# Patient Record
Sex: Female | Born: 1955 | Race: White | Hispanic: No | Marital: Single | Smoking: Former smoker
Health system: Southern US, Community
[De-identification: ages and names within clinical notes are randomized; demographics above are authoritative.]

## PROBLEM LIST (undated history)

## (undated) DIAGNOSIS — F419 Anxiety disorder, unspecified: Secondary | ICD-10-CM

## (undated) DIAGNOSIS — K589 Irritable bowel syndrome without diarrhea: Secondary | ICD-10-CM

## (undated) DIAGNOSIS — K52832 Lymphocytic colitis: Secondary | ICD-10-CM

## (undated) DIAGNOSIS — F329 Major depressive disorder, single episode, unspecified: Secondary | ICD-10-CM

## (undated) DIAGNOSIS — K5790 Diverticulosis of intestine, part unspecified, without perforation or abscess without bleeding: Secondary | ICD-10-CM

## (undated) DIAGNOSIS — F32A Depression, unspecified: Secondary | ICD-10-CM

## (undated) HISTORY — DX: Irritable bowel syndrome, unspecified: K58.9

## (undated) HISTORY — DX: Major depressive disorder, single episode, unspecified: F32.9

## (undated) HISTORY — PX: ARTHROSCOPIC REPAIR ACL: SUR80

## (undated) HISTORY — PX: GANGLION CYST EXCISION: SHX1691

## (undated) HISTORY — DX: Lymphocytic colitis: K52.832

## (undated) HISTORY — DX: Anxiety disorder, unspecified: F41.9

## (undated) HISTORY — DX: Depression, unspecified: F32.A

## (undated) HISTORY — DX: Diverticulosis of intestine, part unspecified, without perforation or abscess without bleeding: K57.90

---

## 1998-03-13 ENCOUNTER — Inpatient Hospital Stay (HOSPITAL_COMMUNITY): Admission: EM | Admit: 1998-03-13 | Discharge: 1998-03-16 | Payer: Self-pay | Admitting: Emergency Medicine

## 1999-01-04 ENCOUNTER — Other Ambulatory Visit: Admission: RE | Admit: 1999-01-04 | Discharge: 1999-01-04 | Payer: Self-pay | Admitting: *Deleted

## 1999-09-03 ENCOUNTER — Ambulatory Visit (HOSPITAL_COMMUNITY): Admission: RE | Admit: 1999-09-03 | Discharge: 1999-09-03 | Payer: Self-pay | Admitting: Gastroenterology

## 2000-09-28 ENCOUNTER — Encounter: Admission: RE | Admit: 2000-09-28 | Discharge: 2000-09-28 | Payer: Self-pay | Admitting: Family Medicine

## 2000-09-28 ENCOUNTER — Encounter: Payer: Self-pay | Admitting: Family Medicine

## 2001-07-31 ENCOUNTER — Other Ambulatory Visit: Admission: RE | Admit: 2001-07-31 | Discharge: 2001-07-31 | Payer: Self-pay | Admitting: Internal Medicine

## 2002-08-07 ENCOUNTER — Other Ambulatory Visit: Admission: RE | Admit: 2002-08-07 | Discharge: 2002-08-07 | Payer: Self-pay | Admitting: *Deleted

## 2003-07-26 LAB — HM COLONOSCOPY: HM Colonoscopy: NORMAL

## 2007-11-23 LAB — CONVERTED CEMR LAB: Pap Smear: NORMAL

## 2008-03-25 LAB — HM MAMMOGRAPHY: HM Mammogram: NORMAL

## 2009-06-17 ENCOUNTER — Ambulatory Visit: Payer: Self-pay | Admitting: Family Medicine

## 2009-06-17 DIAGNOSIS — F329 Major depressive disorder, single episode, unspecified: Secondary | ICD-10-CM

## 2009-06-17 DIAGNOSIS — B009 Herpesviral infection, unspecified: Secondary | ICD-10-CM | POA: Insufficient documentation

## 2009-07-14 ENCOUNTER — Ambulatory Visit: Payer: Self-pay | Admitting: Family Medicine

## 2009-07-14 DIAGNOSIS — J069 Acute upper respiratory infection, unspecified: Secondary | ICD-10-CM | POA: Insufficient documentation

## 2009-12-01 ENCOUNTER — Ambulatory Visit: Payer: Self-pay | Admitting: Family Medicine

## 2009-12-01 DIAGNOSIS — N959 Unspecified menopausal and perimenopausal disorder: Secondary | ICD-10-CM | POA: Insufficient documentation

## 2010-01-29 ENCOUNTER — Ambulatory Visit: Payer: Self-pay | Admitting: Family Medicine

## 2010-02-03 ENCOUNTER — Ambulatory Visit: Payer: Self-pay | Admitting: Licensed Clinical Social Worker

## 2010-03-04 ENCOUNTER — Ambulatory Visit: Payer: Self-pay | Admitting: Family Medicine

## 2010-04-12 ENCOUNTER — Telehealth (INDEPENDENT_AMBULATORY_CARE_PROVIDER_SITE_OTHER): Payer: Self-pay | Admitting: *Deleted

## 2010-05-06 ENCOUNTER — Ambulatory Visit: Payer: Self-pay | Admitting: Family Medicine

## 2010-05-06 DIAGNOSIS — J309 Allergic rhinitis, unspecified: Secondary | ICD-10-CM | POA: Insufficient documentation

## 2010-05-06 DIAGNOSIS — G4700 Insomnia, unspecified: Secondary | ICD-10-CM | POA: Insufficient documentation

## 2010-07-07 ENCOUNTER — Telehealth: Payer: Self-pay | Admitting: Family Medicine

## 2010-07-12 ENCOUNTER — Ambulatory Visit: Payer: Self-pay | Admitting: Family Medicine

## 2010-07-12 DIAGNOSIS — L259 Unspecified contact dermatitis, unspecified cause: Secondary | ICD-10-CM

## 2010-07-27 ENCOUNTER — Telehealth: Payer: Self-pay | Admitting: Family Medicine

## 2010-07-28 ENCOUNTER — Encounter: Payer: Self-pay | Admitting: Family Medicine

## 2010-08-24 NOTE — Letter (Signed)
Summary: Out of Work  Adult nurse at [employer]  [address]   [place], [place] [address]   Phone: [phone]  Fax: [phone]    January 29, 2010   Employee:  Michelle Jensen [facility]    To Whom It May Concern:   For Medical reasons, please excuse the above named employee from work for the following dates:  Start:   01/29/2010  End:   01/29/2010, pt may return to work, regular duties on Monday, 02/01/2010  If you need additional information, please feel free to contact our office.         Sincerely,        Evelena Peat, MD

## 2010-08-24 NOTE — Progress Notes (Signed)
Summary: LMTCB 9-21  Phone Note Call from Patient Call back at Home Phone [phone]   Caller: VM Summary of Call: Bad panic attack.  Requesting Rx for med 1 mo worth Xanax or some antianxiety.  Take antidepressants & have had relapse, bad news.  [facility] Initial call taken by: Rudy Jew, RN,  April 12, 2010 3:45 PM  Follow-up for Phone Call        If depression has relapsed or worsened, pt needs to be seen to reassess.  I would be OK with calling in limited Lorazepam 0.5 mg one by mouth q 6 hours as needed severe anxiety #20 with no refills until follow up. Follow-up by: Evelena Peat MD,  April 12, 2010 5:39 PM  Additional Follow-up for Phone Call Additional follow up Details #1::        Left message to call back. Rudy Jew, RN  April 14, 2010 8:14 AM [facility] again.  Rudy Jew, RN  April 14, 2010 2:33 PM       Additional Follow-up for Phone Call Additional follow up Details #2::    Notified pt. Follow-up by: Lynann Beaver CMA,  April 15, 2010 3:05 PM  New/Updated Medications: LORAZEPAM 0.5 MG TABS (LORAZEPAM) One by mouth every  6 hrs as needed severe anxiety Prescriptions: LORAZEPAM 0.5 MG TABS (LORAZEPAM) One by mouth every  6 hrs as needed severe anxiety  #20 x 0   Entered by:   Rudy Jew, RN   Authorized by:   Evelena Peat MD   Signed by:   Rudy Jew, RN on 04/14/2010   Method used:   Telephoned to ...       [facility]  [place] [address] (retail)       [address]       [place], [place]  [address]       Ph: [phone] or [phone]       Fax: [phone]   RxID:   [id number]

## 2010-08-24 NOTE — Assessment & Plan Note (Signed)
Summary: fup//ccm   Vital Signs:  Patient profile:   55 year old female Menstrual status:  irregular Weight:      167 pounds Temp:     98.1 degrees F oral Pulse rate:   66 / minute BP sitting:   100 / 70  (left arm) Cuff size:   regular  Vitals Entered By: Romualdo Bolk, CMA (AAMA) (March 04, 2010 1:29 PM) CC: follow-up visit   History of Present Illness: Depression greatly improved. Recent addition of Abilify to her other medications. Brighter mood. Sleep is good. Good energy levels. She is also doing counseling which has helped considerably. Has read several  books which seemed to have helped her outlook as well. No side effects from medication. No exercise.   Current Medications (verified): 1)  Sertraline Hcl 100 Mg Tabs (Sertraline Hcl) .... Once Daily 2)  Venlafaxine Hcl 100 Mg Tabs (Venlafaxine Hcl) .... Once Daily 3)  Abilify 2 Mg Tabs (Aripiprazole) .... One By Mouth At Bedtime  Allergies (verified): No Known Drug Allergies  Past History:  Past Medical History: Last updated: 06/17/2009 Depression  Past Surgical History: Last updated: 06/17/2009 Arthroscopic ACL Rt knee 2003 Ganglion cyst right wrist 1974  Family History: Last updated: 06/17/2009 Family History of Colon CA, mother Family History of Alcoholism/Addiction, sister Family history emotional illness, siblings  Social History: Last updated: 06/17/2009 Occupation: Single Alcohol use-yes Past smoker, quit 2008  Physical Exam  General:  Well-developed,well-nourished,in no acute distress; alert,appropriate and cooperative throughout examination Neck:  No deformities, masses, or tenderness noted. Lungs:  Normal respiratory effort, chest expands symmetrically. Lungs are clear to auscultation, no crackles or wheezes. Heart:  Normal rate and regular rhythm. S1 and S2 normal without gallop, murmur, click, rub or other extra sounds. Psych:  normally interactive, good eye contact, not anxious  appearing, and not depressed appearing.     Impression & Recommendations:  Problem # 1:  DEPRESSION (ICD-311) Assessment Improved Cont Abilify at low dose with meds below.  She is encouraged to cont. counseling. Her updated medication list for this problem includes:    Sertraline Hcl 100 Mg Tabs (Sertraline hcl) ..... Once daily    Venlafaxine Hcl 100 Mg Tabs (Venlafaxine hcl) ..... Once daily  Complete Medication List: 1)  Sertraline Hcl 100 Mg Tabs (Sertraline hcl) .... Once daily 2)  Venlafaxine Hcl 100 Mg Tabs (Venlafaxine hcl) .... Once daily 3)  Abilify 2 Mg Tabs (Aripiprazole) .... One by mouth at bedtime  Patient Instructions: 1)  Please schedule a follow-up appointment in 6 months .  Prescriptions: ABILIFY 2 MG TABS (ARIPIPRAZOLE) one by mouth at bedtime  #30 x 11   Entered and Authorized by:   Evelena Peat MD   Signed by:   Evelena Peat MD on 03/04/2010   Method used:   [facility]ronically to        [facility]  [place] [address] [facility]retail)       [address]       [place], [place]  [address]       Ph: [phone] or [phone]       Fax: [phone]   RxID:   [id number]

## 2010-08-24 NOTE — Assessment & Plan Note (Signed)
Summary: DEPRESSION//SLM   Vital Signs:  Patient profile:   55 year old female Menstrual status:  irregular Weight:      168 pounds BMI:     28.06 Temp:     98.2 degrees F oral BP sitting:   110 / 78  (left arm) Cuff size:   regular  Vitals Entered By: Sid Falcon LPN (May 06, 2010 2:26 PM)  History of Present Illness: Here for followup depression issues. She takes medications including sertraline, Effexor, and Abilify. Recent p.r.n. use of lorazepam at night which is helping her sleep. She continues to battle some depression issues but no suicidal ideation. She's had some counseling though inconsistently. She has been missing some work because of depression flareups. Would like to have release noted to back to work next week. We've previously filled out FMLA papers.  Regarding her insomnia, she does not use excessive caffeine and no consistent night use of ETOH.  One week of nasal congestion intermittent sneezing and occasional dry cough. No fever. No body aches.  She has considered possible allergy trigger but has not taken any antihistamine.  Allergies (verified): No Known Drug Allergies  Past History:  Past Medical History: Last updated: 06/17/2009 Depression  Past Surgical History: Last updated: 06/17/2009 Arthroscopic ACL Rt knee 2003 Ganglion cyst right wrist 1974  Family History: Last updated: 06/17/2009 Family History of Colon CA, mother Family History of Alcoholism/Addiction, sister Family history emotional illness, siblings  Social History: Last updated: 05/06/2010 Occupation:  Stewardess Single Alcohol use-yes Past smoker, quit 2008 PMH-FH-SH reviewed for relevance  Social History: Occupation:  Stewardess Single Alcohol use-yes Past smoker, quit 2008  Review of Systems  The patient denies anorexia, fever, weight loss, hoarseness, chest pain, syncope, dyspnea on exertion, peripheral edema, prolonged cough, headaches, abdominal pain, and  muscle weakness.    Physical Exam  General:  Well-developed,well-nourished,in no acute distress; alert,appropriate and cooperative throughout examination Eyes:  pupils equal, pupils round, and pupils reactive to light.   Ears:  External ear exam shows no significant lesions or deformities.  Otoscopic examination reveals clear canals, tympanic membranes are intact bilaterally without bulging, retraction, inflammation or discharge. Hearing is grossly normal bilaterally. Nose:  External nasal examination shows no deformity or inflammation. Nasal mucosa are pink and moist without lesions or exudates. Mouth:  Oral mucosa and oropharynx without lesions or exudates.  Teeth in good repair. Neck:  No deformities, masses, or tenderness noted. Lungs:  Normal respiratory effort, chest expands symmetrically. Lungs are clear to auscultation, no crackles or wheezes. Heart:  Normal rate and regular rhythm. S1 and S2 normal without gallop, murmur, click, rub or other extra sounds. Neurologic:  alert & oriented X3, cranial nerves II-XII intact, and strength normal in all extremities.   Psych:  normally interactive, good eye contact, not depressed appearing, and slightly anxious.     Impression & Recommendations:  Problem # 1:  DEPRESSION (ICD-311) she is strongly encouraged to cont with counseling. Her updated medication list for this problem includes:    Sertraline Hcl 100 Mg Tabs (Sertraline hcl) ..... Once daily    Venlafaxine Hcl 100 Mg Tabs (Venlafaxine hcl) ..... Once daily    Lorazepam 0.5 Mg Tabs (Lorazepam) ..... One by mouth every  6 hrs as needed severe anxiety  Problem # 2:  ALLERGIC RHINITIS (ICD-477.9) try OTC Allegra or Zyrtec.  Problem # 3:  INSOMNIA, TRANSIENT (ICD-780.52) refilled lorazepam with rec for short term use only.  Complete Medication List: 1)  Sertraline Hcl 100  Mg Tabs (Sertraline hcl) .... Once daily 2)  Venlafaxine Hcl 100 Mg Tabs (Venlafaxine hcl) .... Once daily 3)   Abilify 2 Mg Tabs (Aripiprazole) .... One by mouth at bedtime 4)  Lorazepam 0.5 Mg Tabs (Lorazepam) .... One by mouth every  6 hrs as needed severe anxiety  Other Orders: Admin 1st Vaccine (87564) Flu Vaccine 20yrs + (33295)  Patient Instructions: 1)  try over-the-counter antihistamine such as Allegra or Zyrtec Prescriptions: LORAZEPAM 0.5 MG TABS (LORAZEPAM) One by mouth every  6 hrs as needed severe anxiety  #30 x 1   Entered and Authorized by:   Evelena Peat MD   Signed by:   Evelena Peat MD on 05/06/2010   Method used:   Print then Give to Patient   RxID:   [id number]      Flu Vaccine Consent Questions     Do you have a history of severe allergic reactions to this vaccine? no    Any prior history of allergic reactions to egg and/or gelatin? no    Do you have a sensitivity to the preservative Thimersol? no    Do you have a past history of Guillan-Barre Syndrome? no    Do you currently have an acute febrile illness? no    Have you ever had a severe reaction to latex? no    Vaccine information given and explained to patient? yes    Are you currently pregnant? no    Lot Number:[id number]   Exp Date:01/22/2011   Site Given  Left Deltoid IMbflu

## 2010-08-24 NOTE — Assessment & Plan Note (Signed)
Summary: trouble sleeping/njr   Vital Signs:  Patient profile:   55 year old female Menstrual status:  irregular Temp:     98.6 degrees F oral BP sitting:   130 / 70  (left arm) Cuff size:   regular  Vitals Entered By: Sid Falcon LPN (January 30, 8415 3:51 PM) CC: Trouble sleeping   History of Present Illness: Patient seen with worsening depressive symptoms.  Long history of recurrent depression treated for several years. Currently takes sertraline 100 mg daily and venlafaxine 100 mg daily. She has problems with early morning awakening, frequent crying spells, and low motivation. Pessimistic outlook toward the future. Denies suicidal ideation.  She has arranged to start back some counseling next week. She would like to consider augmentation in antidepressant medications at this time.  Allergies (verified): No Known Drug Allergies  Past History:  Past Medical History: Last updated: 06/17/2009 Depression  Social History: Last updated: 06/17/2009 Occupation: Single Alcohol use-yes Past smoker, quit 2008  Review of Systems      See HPI  Physical Exam  General:  patient is alert and cooperative. Tearful off and on during exam and interview Lungs:  Normal respiratory effort, chest expands symmetrically. Lungs are clear to auscultation, no crackles or wheezes. Heart:  normal rate and regular rhythm.   Psych:  Oriented X3, good eye contact, depressed affect, and slightly anxious.     Impression & Recommendations:  Problem # 1:  DEPRESSION (ICD-311) Assessment Deteriorated add Abilify 2 mg q.h.s. and titrate after 3 days to 4 mg as needed. Reassess in 3 weeks time.  Counseling has been arranged. Her updated medication list for this problem includes:    Sertraline Hcl 100 Mg Tabs (Sertraline hcl) ..... Once daily    Venlafaxine Hcl 100 Mg Tabs (Venlafaxine hcl) ..... Once daily  Complete Medication List: 1)  Sertraline Hcl 100 Mg Tabs (Sertraline hcl) .... Once  daily 2)  Venlafaxine Hcl 100 Mg Tabs (Venlafaxine hcl) .... Once daily 3)  Abilify 2 Mg Tabs (Aripiprazole) .... One by mouth at bedtime  Patient Instructions: 1)  Schedule followup appointment in 3 weeks to reassess 2)  May titrate Abilify to 2 tablets at night after 3 days as needed Prescriptions: ABILIFY 2 MG TABS (ARIPIPRAZOLE) one by mouth at bedtime  #30 x 1   Entered and Authorized by:   Evelena Peat MD   Signed by:   Evelena Peat MD on 01/29/2010   Method used:   [facility]        [facility]  [place] [address] [facility]       [address]       [place], [place]  [address]       Ph: [phone] or [phone]       Fax: [phone]   RxID:   [id number]

## 2010-08-24 NOTE — Letter (Signed)
Summary: Out of Work  Adult nurse at [employer]  [address]   [place], [place] [address]   Phone: [phone]  Fax: [phone]    Dec 01, 2009   Employee:  CANDIA [employer]ING[employer]    To Whom It May Concern:   For Medical reasons, please excuse the above named employee from work for the following dates:  Start:    End:   Patient may return to unrestricted work duty 12-01-09  If you need additional information, please feel free to contact our office.         Sincerely,    Evelena Peat MD

## 2010-08-24 NOTE — Assessment & Plan Note (Signed)
Summary: follow up/cjr   Vital Signs:  Patient profile:   55 year old female Menstrual status:  irregular Height:      65 inches Weight:      164 pounds Temp:     97.9 degrees F oral BP sitting:   112 / 72  (left arm) Cuff size:   regular  Vitals Entered By: Sid Falcon LPN (Dec 01, 2009 10:05 AM) CC: Follow-up, med refills   History of Present Illness: Patient here discuss multiple items. Depression relatively stable. Still has some relapses at times. Remains compliant with medication. Has taken part in some counseling since last visit.  No suicidal ideation.  Menses are becoming more irregular. Occasional hot flushes. She thinks she is nearing menopause.  Several preventative issues discussed. She needs repeat colonoscopy this year (pos FH of colon cancer in mother). Due for mammogram. No complete physical in over a year.  Out of work since around [place] secondary to depression issues. Needs work release at  this time.  Allergies (verified): No Known Drug Allergies  Past History:  Past Medical History: Last updated: 06/17/2009 Depression  Past Surgical History: Last updated: 06/17/2009 Arthroscopic ACL Rt knee 2003 Ganglion cyst right wrist 1974  Family History: Last updated: 06/17/2009 Family History of Colon CA, mother Family History of Alcoholism/Addiction, sister Family history emotional illness, siblings  Social History: Last updated: 06/17/2009 Occupation: Single Alcohol use-yes Past smoker, quit 2008 PMH-FH-SH reviewed for relevance  Review of Systems  The patient denies anorexia, fever, weight loss, weight gain, chest pain, syncope, dyspnea on exertion, peripheral edema, prolonged cough, headaches, hemoptysis, abdominal pain, hematochezia, and severe indigestion/heartburn.    Physical Exam  General:  Well-developed,well-nourished,in no acute distress; alert,appropriate and cooperative throughout examination Ears:  External ear exam shows no  significant lesions or deformities.  Otoscopic examination reveals clear canals, tympanic membranes are intact bilaterally without bulging, retraction, inflammation or discharge. Hearing is grossly normal bilaterally. Mouth:  Oral mucosa and oropharynx without lesions or exudates.  Teeth in good repair. Neck:  No deformities, masses, or tenderness noted. Lungs:  Normal respiratory effort, chest expands symmetrically. Lungs are clear to auscultation, no crackles or wheezes. Heart:  normal rate, regular rhythm, and no murmur.   Extremities:  No clubbing, cyanosis, edema, or deformity noted with normal full range of motion of all joints.   Psych:  normally interactive, good eye contact, not anxious appearing, and not depressed appearing.     Impression & Recommendations:  Problem # 1:  DEPRESSION (ICD-311) cont current meds.  Pt encouraged to continue counseling. Her updated medication list for this problem includes:    Sertraline Hcl 100 Mg Tabs (Sertraline hcl) ..... Once daily    Venlafaxine Hcl 100 Mg Tabs (Venlafaxine hcl) ..... Once daily  Problem # 2:  PERIMENOPAUSAL SYNDROME (ICD-627.9) Long discussion regarding pros and cons of hormone replacement therapy.  Pt encouraged to schedule CPE.  Complete Medication List: 1)  Sertraline Hcl 100 Mg Tabs (Sertraline hcl) .... Once daily 2)  Venlafaxine Hcl 100 Mg Tabs (Venlafaxine hcl) .... Once daily  Patient Instructions: 1)  scheduled Complete physical examination

## 2010-08-26 NOTE — Letter (Signed)
Summary: Out of Work  Adult nurse at [employer]  [address]   [place], [place] [address]   Phone: [phone]  Fax: [phone]    July 12, 2010          Employee:  Michelle Jensen    To Whom It May Concern:   For Medical reasons, please excuse the above named employee from work for the following dates:  Start:   07/12/2010  End:   07/12/2010  If you need additional information, please feel free to contact our office.         Sincerely,         Evelena Peat, MD

## 2010-08-26 NOTE — Progress Notes (Signed)
Summary: Cleared to RTW letter request  Phone Note Call from Patient   Caller: Patient Call For: Evelena Peat MD Summary of Call: Pt left a VM requesting a RTW release note.  Pt had OV on 12/19.  VM said she had a "family blow-up over the holidays" and needs ar RTW note, cleared to RTW on Tuesday 07/27/2010 Initial call taken by: Sid Falcon LPN,  July 27, 2010 3:14 PM  Follow-up for Phone Call        I was not aware that she was out of work (other than 07/12/10) when seen here.  May generate return to work note for the 3rd. Follow-up by: Evelena Peat MD,  July 27, 2010 6:08 PM  Additional Follow-up for Phone Call Additional follow up Details #1::        Will generate letter and pt informed ready to pick-up Additional Follow-up by: Sid Falcon LPN,  July 28, 2010 12:24 PM

## 2010-08-26 NOTE — Letter (Signed)
Summary: Out of Work  Adult nurse at [employer]  [address]   [place], [place] [address]   Phone: [phone]  Fax: [phone]    July 28, 2010   Employee:  Michelle Jensen [facility] [employer] [facility]    To Whom It May Concern:   For Medical reasons, please excuse the above named employee from work for the following dates:  Start:    End:   Pt cleared to return to work Tuesday, July 27, 2010  If you need additional information, please feel free to contact our office.         Sincerely,       Evelena Peat, MD

## 2010-08-26 NOTE — Assessment & Plan Note (Signed)
Summary: fu on meds/njr   Vital Signs:  Patient profile:   55 year old female Menstrual status:  irregular Weight:      170 pounds Temp:     98.1 degrees F oral BP sitting:   110 / 70  (left arm) Cuff size:   regular  Vitals Entered By: Sid Falcon LPN (July 12, 2010 1:37 PM)  History of Present Illness: followup depression. She has discontinued Abilify. Remains on sertraline and Effexor. Depression relatively stable. Currently not in counseling. Plan to start some exercise first of the year. Has been consuming some alcohol recently and she has recognized more than healthy at times.  Bilateral pruritus ear canals. No drainage. No other rashes. No aggravating or alleviating factors  Allergies (verified): No Known Drug Allergies  Past History:  Past Medical History: Last updated: 06/17/2009 Depression  Past Surgical History: Last updated: 06/17/2009 Arthroscopic ACL Rt knee 2003 Ganglion cyst right wrist 1974  Family History: Last updated: 06/17/2009 Family History of Colon CA, mother Family History of Alcoholism/Addiction, sister Family history emotional illness, siblings  Social History: Last updated: 05/06/2010 Occupation:  Stewardess Single Alcohol use-yes Past smoker, quit 2008 PMH-FH-SH reviewed for relevance  Physical Exam  General:  Well-developed,well-nourished,in no acute distress; alert,appropriate and cooperative throughout examination Ears:  mild scaling and eczema changes both ear canals. Eardrums normal Mouth:  Oral mucosa and oropharynx without lesions or exudates.  Teeth in good repair. Neck:  No deformities, masses, or tenderness noted. Lungs:  Normal respiratory effort, chest expands symmetrically. Lungs are clear to auscultation, no crackles or wheezes. Heart:  Normal rate and regular rhythm. S1 and S2 normal without gallop, murmur, click, rub or other extra sounds. Psych:  normally interactive, good eye contact, not depressed appearing,  and slightly anxious.     Impression & Recommendations:  Problem # 1:  DEPRESSION (ICD-311) discussed possible tapering of Venlafaxine and she prefers to wait after the New Year. Her updated medication list for this problem includes:    Sertraline Hcl 100 Mg Tabs (Sertraline hcl) ..... Once daily    Venlafaxine Hcl 100 Mg Tabs (Venlafaxine hcl) ..... Once daily    Lorazepam 0.5 Mg Tabs (Lorazepam) ..... One by mouth every  6 hrs as needed severe anxiety  Problem # 2:  ECZEMA (ICD-692.9) Assessment: New  Her updated medication list for this problem includes:    Elocon 0.1 % Lotn (Mometasone furoate) .Marland Kitchen... Apply to affected rash as needed  Complete Medication List: 1)  Sertraline Hcl 100 Mg Tabs (Sertraline hcl) .... Once daily 2)  Venlafaxine Hcl 100 Mg Tabs (Venlafaxine hcl) .... Once daily 3)  Abilify 2 Mg Tabs (Aripiprazole) .... One by mouth at bedtime 4)  Lorazepam 0.5 Mg Tabs (Lorazepam) .... One by mouth every  6 hrs as needed severe anxiety 5)  Elocon 0.1 % Lotn (Mometasone furoate) .... Apply to affected rash as needed Prescriptions: ELOCON 0.1 % LOTN (MOMETASONE FUROATE) apply to affected rash as needed  #1 bottle x 3   Entered and Authorized by:   Evelena Peat MD   Signed by:   Evelena Peat MD on 07/12/2010   Method used:   Print then Give to Patient   RxID:   [id number]    Orders Added: 1)  Est. Patient Level III [56213]

## 2010-08-26 NOTE — Progress Notes (Signed)
Summary: Lorazepam refill request  Phone Note From Pharmacy   [facility]  [place] [address] Call For: burchette  Summary of Call: Refill request from pharmacy for Lorazepam, #30 with 1 refill given to pt on 05/06/10 At 8/11 OV you had asked pt to return in 6 months, that would be Fed.  No appt scheduled yet. Initial call taken by: Sid Falcon LPN,  July 07, 2010 1:59 PM  Follow-up for Phone Call        schedule follow up and OK to refill times 3. Follow-up by: Evelena Peat MD,  July 07, 2010 5:56 PM  Additional Follow-up for Phone Call Additional follow up Details #1::        Msg left on home phone and pharmacy pt needs return OV in Feb 2012 Additional Follow-up by: Sid Falcon LPN,  July 08, 2010 9:28 AM    Prescriptions: LORAZEPAM 0.5 MG TABS (LORAZEPAM) One by mouth every  6 hrs as needed severe anxiety  #30 x 3   Entered by:   Sid Falcon LPN   Authorized by:   Evelena Peat MD   Signed by:   Sid Falcon LPN on 98/05/9146   Method used:   Telephoned to ...       [facility]  [place] [address] ([facility]ail)       [address]       [place], [place]  [address]       Ph: [phone] or [phone]       Fax: [phone]   RxID:   [id number]

## 2010-09-19 ENCOUNTER — Other Ambulatory Visit: Payer: Self-pay | Admitting: Family Medicine

## 2010-09-19 DIAGNOSIS — F329 Major depressive disorder, single episode, unspecified: Secondary | ICD-10-CM

## 2010-09-21 ENCOUNTER — Ambulatory Visit: Payer: Self-pay | Admitting: Psychology

## 2010-09-28 ENCOUNTER — Ambulatory Visit: Payer: Self-pay | Admitting: Licensed Clinical Social Worker

## 2010-09-28 DIAGNOSIS — F411 Generalized anxiety disorder: Secondary | ICD-10-CM

## 2010-09-28 DIAGNOSIS — F331 Major depressive disorder, recurrent, moderate: Secondary | ICD-10-CM

## 2010-10-06 ENCOUNTER — Ambulatory Visit: Payer: Self-pay | Admitting: Licensed Clinical Social Worker

## 2010-10-25 ENCOUNTER — Telehealth: Payer: Self-pay | Admitting: *Deleted

## 2010-10-25 ENCOUNTER — Ambulatory Visit: Payer: Self-pay | Admitting: Family Medicine

## 2010-10-25 NOTE — Telephone Encounter (Signed)
Pt 2nd no show for 15 min 3 month F/U today. 1st No Show was 02/26/10 Left message on pt mobile/home phone

## 2010-10-28 ENCOUNTER — Encounter: Payer: Self-pay | Admitting: Family Medicine

## 2010-11-01 ENCOUNTER — Ambulatory Visit (INDEPENDENT_AMBULATORY_CARE_PROVIDER_SITE_OTHER): Payer: Managed Care, Other (non HMO) | Admitting: Family Medicine

## 2010-11-01 ENCOUNTER — Encounter: Payer: Self-pay | Admitting: Family Medicine

## 2010-11-01 DIAGNOSIS — F329 Major depressive disorder, single episode, unspecified: Secondary | ICD-10-CM

## 2010-11-01 NOTE — Patient Instructions (Signed)
Consider complete physical at some point later this year. 

## 2010-11-01 NOTE — Progress Notes (Signed)
  Subjective:    Patient ID: Michelle Jensen, female    DOB: 1956-04-04, 55 y.o.   MRN: [id number]  HPI Followup recurrent major depressive episode. Overall improved. Remains on Zoloft and Effexor. No side effects from medication. Compliant with all medications. No suicidal ideation. She has had extensive counseling in the past. She has been out of work for the past month with recent chest skin lesion excision per derm (benign) and retinal issue (no detachment). She has tapered off of lorazepam is also not taking Abilify.  Review of Systems  Constitutional: Negative for appetite change and unexpected weight change.  Respiratory: Negative for cough and shortness of breath.   Cardiovascular: Negative for chest pain.  Gastrointestinal: Negative for abdominal pain.  Psychiatric/Behavioral: Negative for suicidal ideas, confusion and agitation.       Objective:   Physical Exam  Constitutional: She is oriented to person, place, and time. She appears well-developed and well-nourished.  Cardiovascular: Normal rate, regular rhythm and normal heart sounds.   No murmur heard. Pulmonary/Chest: Effort normal and breath sounds normal. No respiratory distress. She has no wheezes. She has no rales.  Musculoskeletal: She exhibits no edema.  Neurological: She is alert and oriented to person, place, and time. No cranial nerve deficit.  Psychiatric: She has a normal mood and affect. Her behavior is normal.          Assessment & Plan:  #1 recurrent depression. Stable. Continue current medications. #2 health maintenance. Encouraged complete physical. She's not had mammogram approximately 2 years is encouraged to set up. She also will be due for repeat colonoscopy soon with positive family history of colon cancer

## 2010-11-09 NOTE — Telephone Encounter (Signed)
Pt did not call back

## 2010-11-24 ENCOUNTER — Other Ambulatory Visit: Payer: Managed Care, Other (non HMO)

## 2010-11-25 ENCOUNTER — Other Ambulatory Visit (INDEPENDENT_AMBULATORY_CARE_PROVIDER_SITE_OTHER): Payer: Managed Care, Other (non HMO) | Admitting: Family Medicine

## 2010-11-25 DIAGNOSIS — Z0000 Encounter for general adult medical examination without abnormal findings: Secondary | ICD-10-CM

## 2010-11-25 DIAGNOSIS — Z13220 Encounter for screening for lipoid disorders: Secondary | ICD-10-CM

## 2010-11-25 LAB — CBC WITH DIFFERENTIAL/PLATELET
Basophils Relative: 0.7 % (ref 0.0–3.0)
Eosinophils Relative: 2.1 % (ref 0.0–5.0)
Lymphocytes Relative: 37.1 % (ref 12.0–46.0)
Monocytes Relative: 8.5 % (ref 3.0–12.0)
Neutrophils Relative %: 51.6 % (ref 43.0–77.0)
RBC: 3.97 Mil/uL (ref 3.87–5.11)
WBC: 8.7 10*3/uL (ref 4.5–10.5)

## 2010-11-25 LAB — POCT URINALYSIS DIPSTICK
Bilirubin, UA: NEGATIVE
Ketones, UA: NEGATIVE
Leukocytes, UA: NEGATIVE
Protein, UA: NEGATIVE

## 2010-11-25 LAB — BASIC METABOLIC PANEL
BUN: 10 mg/dL (ref 6–23)
CO2: 25 mEq/L (ref 19–32)
Calcium: 9.1 mg/dL (ref 8.4–10.5)
Chloride: 100 mEq/L (ref 96–112)
Creatinine, Ser: 0.7 mg/dL (ref 0.4–1.2)
GFR: 99.03 mL/min (ref >60.00)
Glucose, Bld: 88 mg/dL (ref 70–99)
Potassium: 4.9 mEq/L (ref 3.5–5.1)
Sodium: 136 mEq/L (ref 135–145)

## 2010-11-25 LAB — HEPATIC FUNCTION PANEL
ALT: 18 U/L (ref 0–35)
Alkaline Phosphatase: 54 U/L (ref 39–117)
Bilirubin, Direct: 0 mg/dL (ref 0.0–0.3)
Total Protein: 6.6 g/dL (ref 6.0–8.3)

## 2010-11-25 LAB — TSH: TSH: 3.02 u[IU]/mL (ref 0.35–5.50)

## 2010-11-25 LAB — LIPID PANEL: HDL: 75 mg/dL (ref >39.00)

## 2010-12-01 ENCOUNTER — Ambulatory Visit (INDEPENDENT_AMBULATORY_CARE_PROVIDER_SITE_OTHER): Payer: Managed Care, Other (non HMO) | Admitting: Family Medicine

## 2010-12-01 ENCOUNTER — Encounter: Payer: Self-pay | Admitting: Family Medicine

## 2010-12-01 ENCOUNTER — Other Ambulatory Visit (HOSPITAL_COMMUNITY)
Admission: RE | Admit: 2010-12-01 | Discharge: 2010-12-01 | Disposition: A | Discharge status: Home / Self Care | Payer: Self-pay | Source: Ambulatory Visit | Attending: Family Medicine | Admitting: Family Medicine

## 2010-12-01 DIAGNOSIS — R319 Hematuria, unspecified: Secondary | ICD-10-CM

## 2010-12-01 DIAGNOSIS — Z23 Encounter for immunization: Secondary | ICD-10-CM

## 2010-12-01 DIAGNOSIS — Z299 Encounter for prophylactic measures, unspecified: Secondary | ICD-10-CM

## 2010-12-01 DIAGNOSIS — L259 Unspecified contact dermatitis, unspecified cause: Secondary | ICD-10-CM

## 2010-12-01 DIAGNOSIS — Z01419 Encounter for gynecological examination (general) (routine) without abnormal findings: Secondary | ICD-10-CM | POA: Insufficient documentation

## 2010-12-01 LAB — POCT URINALYSIS DIPSTICK
Protein, UA: NEGATIVE
Spec Grav, UA: 1.015
Urobilinogen, UA: 0.2

## 2010-12-01 MED ORDER — METHYLPREDNISOLONE ACETATE 80 MG/ML IJ SUSP
80.0000 mg | Freq: Once | INTRAMUSCULAR | Status: AC
  Administered 2010-12-01: 80 mg via INTRAMUSCULAR

## 2010-12-01 MED ORDER — TETANUS-DIPHTH-ACELL PERTUSSIS 5-2.5-18.5 LF-MCG/0.5 IM SUSP: 0.5000 mL | Freq: Once | INTRAMUSCULAR | Status: DC

## 2010-12-01 NOTE — Patient Instructions (Signed)
We'll call you regarding appointments for colonoscopy and urologist. Go ahead and set up repeat mammogram

## 2010-12-01 NOTE — Progress Notes (Signed)
  Subjective:    Patient ID: Michelle Jensen, female    DOB: 1956-02-24, 55 y.o.   MRN: [id number]  HPI Patient seen for complete physical examination. Past medical history significant for recurrent depression, history of herpes simplex virus, seasonal allergies, and some transient insomnia.  Health maintenance issues addressed. She is due for Pap smear and mammogram. Last colonoscopy was approximately 7 years ago and positive family history of colon cancer in her mother. Last tetanus unknown.  She is also seen with acute issue of rash left forearm antecubital region present for about a week. Mildly pruritic. No pain. No alleviating factors.  Recent labs reviewed with patient. She has elevated cholesterol but excellent HDL. Urinalysis revealed trace hematuria with no leukocytes. Needs repeat today.  Does have history of smoking.  No recent dysuria.  Review of Systems  Constitutional: Negative for fever, chills, activity change and appetite change.  HENT: Negative for trouble swallowing and voice change.   Eyes: Negative for visual disturbance.  Respiratory: Negative for cough and shortness of breath.   Cardiovascular: Negative for chest pain, palpitations and leg swelling.  Gastrointestinal: Negative for nausea, vomiting, abdominal pain, diarrhea, constipation and blood in stool.  Genitourinary: Negative for dysuria.  Musculoskeletal: Negative for back pain.  Neurological: Negative for dizziness, syncope, weakness and headaches.  Hematological: Negative for adenopathy. Does not bruise/bleed easily.  Psychiatric/Behavioral: Positive for dysphoric mood.       Objective:   Physical Exam  Constitutional: She is oriented to person, place, and time. She appears well-developed and well-nourished.  HENT:  Head: Normocephalic and atraumatic.  Right Ear: External ear normal.  Left Ear: External ear normal.  Mouth/Throat: No oropharyngeal exudate.  Eyes: Pupils are equal, round, and reactive  to light.  Neck: Neck supple. No thyromegaly present.  Cardiovascular: Normal rate, regular rhythm and normal heart sounds.   No murmur heard. Pulmonary/Chest: Effort normal and breath sounds normal. No respiratory distress. She has no wheezes. She has no rales.  Abdominal: Soft. Bowel sounds are normal. She exhibits no distension. There is no tenderness. There is no rebound and no guarding.  Genitourinary: Vagina normal and uterus normal. No vaginal discharge found.       Pap smear obtained. Cervix appears normal  Musculoskeletal: She exhibits no edema.  Lymphadenopathy:    She has no cervical adenopathy.  Neurological: She is alert and oriented to person, place, and time. No cranial nerve deficit.  Skin: Rash noted.       Patient has erythematous slightly raised vesicular nontender rash patch distribution left antecubital fossa  Psychiatric: She has a normal mood and affect. Her behavior is normal.          Assessment & Plan:  #1 complete physical examination. Pap smear obtained. Patient needs mammogram. Tetanus booster given. Needs repeat colonoscopy with positive family history of colon cancer and estimated 7 years his last colonoscopy #2 trace hematuria persist on her dipstick today. With her smoking history recommend urology referral for further evaluation #3 contact dermatitis left arm Depo-Medrol 80 mg given

## 2010-12-03 NOTE — Progress Notes (Signed)
Quick Note:  Pt informed on VM ______ 

## 2010-12-07 ENCOUNTER — Telehealth: Payer: Self-pay | Admitting: *Deleted

## 2010-12-07 NOTE — Telephone Encounter (Signed)
At OV last week pt had FMLA paper work to be completed again.  This was done, pt informed ready for pick-up on VM, will scan copy to chart

## 2011-02-22 ENCOUNTER — Other Ambulatory Visit: Payer: Self-pay | Admitting: Family Medicine

## 2011-02-22 DIAGNOSIS — Z1231 Encounter for screening mammogram for malignant neoplasm of breast: Secondary | ICD-10-CM

## 2011-03-04 ENCOUNTER — Ambulatory Visit: Payer: Self-pay

## 2011-06-24 ENCOUNTER — Encounter: Payer: Self-pay | Admitting: Gastroenterology

## 2011-06-30 ENCOUNTER — Telehealth: Payer: Self-pay | Admitting: *Deleted

## 2011-06-30 NOTE — Telephone Encounter (Signed)
Pt reports diarrhea x 1 week; 3-4 episodes daily.  Pepto Bismol not helping. Advised pt of need to be seen. Appt scheduled for tomorrow at 10:45am with Dr Caryl Never.

## 2011-07-01 ENCOUNTER — Ambulatory Visit (INDEPENDENT_AMBULATORY_CARE_PROVIDER_SITE_OTHER): Payer: Medicare HMO | Admitting: Family Medicine

## 2011-07-01 ENCOUNTER — Encounter: Payer: Self-pay | Admitting: Family Medicine

## 2011-07-01 VITALS — BP 100/64 | Temp 97.8°F | Wt 169.0 lb

## 2011-07-01 DIAGNOSIS — R197 Diarrhea, unspecified: Secondary | ICD-10-CM

## 2011-07-01 LAB — BASIC METABOLIC PANEL
GFR: 72.8 mL/min (ref >60.00)
Glucose, Bld: 85 mg/dL (ref 70–99)
Potassium: 5.1 mEq/L (ref 3.5–5.1)
Sodium: 141 mEq/L (ref 135–145)

## 2011-07-01 LAB — CBC WITH DIFFERENTIAL/PLATELET
Eosinophils Relative: 1.3 % (ref 0.0–5.0)
HCT: 41.2 % (ref 36.0–46.0)
Hemoglobin: 13.8 g/dL (ref 12.0–15.0)
Lymphs Abs: 2.7 10*3/uL (ref 0.7–4.0)
Monocytes Relative: 9.3 % (ref 3.0–12.0)
Neutro Abs: 5.7 10*3/uL (ref 1.4–7.7)
WBC: 9.5 10*3/uL (ref 4.5–10.5)

## 2011-07-01 NOTE — Progress Notes (Signed)
  Subjective:    Patient ID: Michelle Jensen, female    DOB: [birth year], 55 y.o.   MRN: [id number]  HPI  Acute visit for diarrhea. Patient has history of probable IBS but mostly constipation issues previously. Onset of diarrhea about 10 days ago. Having about 3-4 stools per day. Initially watery now loose. Denies any bloody stools, fever, or chills. No recent antibiotics. No recent travels. Has not tried any Imodium. Recent TSH normal. Denies abdominal pain. Mild nausea but no vomiting. Does have occasional palpitations but no tachycardia. No recent appetite or weight changes.  Past Medical History  Diagnosis Date  . Depression    Past Surgical History  Procedure Date  . Arthroscopic repair acl     rt knee  . Ganglion cyst excision     rt wrist    reports that she quit smoking about 6 years ago. Her smoking use included Cigarettes. She has a 3 pack-year smoking history. She does not have any smokeless tobacco history on file. She reports that she drinks alcohol. Her drug history not on file. family history includes Alcohol abuse in her sister and Colon cancer in her mother. No Known Allergies    Review of Systems  Constitutional: Negative for fever, activity change, appetite change and unexpected weight change.  Respiratory: Negative for shortness of breath.   Cardiovascular: Negative for chest pain.  Gastrointestinal: Positive for nausea and diarrhea. Negative for vomiting, abdominal pain and blood in stool.  Neurological: Negative for dizziness and weakness.  Hematological: Negative for adenopathy.       Objective:   Physical Exam  Constitutional: She appears well-developed and well-nourished.  HENT:  Mouth/Throat: Oropharynx is clear and moist.  Neck: Neck supple. No thyromegaly present.  Cardiovascular: Normal rate, regular rhythm and normal heart sounds.   Pulmonary/Chest: Effort normal and breath sounds normal. No respiratory distress. She has no wheezes. She has no  rales.  Abdominal: Soft. Bowel sounds are normal. She exhibits no distension and no mass. There is no tenderness. There is no rebound and no guarding.  Musculoskeletal: She exhibits no edema.  Lymphadenopathy:    She has no cervical adenopathy.          Assessment & Plan:  Diarrhea. Question IBS related. Bland diet. Try over-the-counter Imodium for the next couple days. Obtain labs with CBC, basic metabolic panel, and TSH. Consider stool studies next week if symptoms not further improving

## 2011-07-01 NOTE — Patient Instructions (Signed)
Diarrhea Infections caused by germs (bacterial) or a virus commonly cause diarrhea. Your caregiver has determined that with time, rest and fluids, the diarrhea should improve. In general, eat normally while drinking more water than usual. Although water may prevent dehydration, it does not contain salt and minerals (electrolytes). Broths, weak tea without caffeine and oral rehydration solutions (ORS) replace fluids and electrolytes. Small amounts of fluids should be taken frequently. Large amounts at one time may not be tolerated. Plain water may be harmful in infants and the elderly. Oral rehydrating solutions (ORS) are available at pharmacies and grocery stores. ORS replace water and important electrolytes in proper proportions. Sports drinks are not as effective as ORS and may be harmful due to sugars worsening diarrhea.  ORS is especially recommended for use in children with diarrhea. As a general guideline for children, replace any new fluid losses from diarrhea and/or vomiting with ORS as follows:   If your child weighs 22 pounds or under (10 kg or less), give 60-120 mL ( -  cup or 2 - 4 ounces) of ORS for each episode of diarrheal stool or vomiting episode.   If your child weighs more than 22 pounds (more than 10 kgs), give 120-240 mL ( - 1 cup or 4 - 8 ounces) of ORS for each diarrheal stool or episode of vomiting.   While correcting for dehydration, children should eat normally. However, foods high in sugar should be avoided because this may worsen diarrhea. Large amounts of carbonated soft drinks, juice, gelatin desserts and other highly sugared drinks should be avoided.   After correction of dehydration, other liquids that are appealing to the child may be added. Children should drink small amounts of fluids frequently and fluids should be increased as tolerated. Children should drink enough fluids to keep urine clear or pale yellow.   Adults should eat normally while drinking more fluids  than usual. Drink small amounts of fluids frequently and increase as tolerated. Drink enough fluids to keep urine clear or pale yellow. Broths, weak decaffeinated tea, lemon lime soft drinks (allowed to go flat) and ORS replace fluids and electrolytes.   Avoid:   Carbonated drinks.   Juice.   Extremely hot or cold fluids.   Caffeine drinks.   Fatty, greasy foods.   Alcohol.   Tobacco.   Too much intake of anything at one time.   Gelatin desserts.   Probiotics are active cultures of beneficial bacteria. They may lessen the amount and number of diarrheal stools in adults. Probiotics can be found in yogurt with active cultures and in supplements.   Wash hands well to avoid spreading bacteria and virus.   Anti-diarrheal medications are not recommended for infants and children.   Only take over-the-counter or prescription medicines for pain, discomfort or fever as directed by your caregiver. Do not give aspirin to children because it may cause Reye's Syndrome.   For adults, ask your caregiver if you should continue all prescribed and over-the-counter medicines.   If your caregiver has given you a follow-up appointment, it is very important to keep that appointment. Not keeping the appointment could result in a chronic or permanent injury, and disability. If there is any problem keeping the appointment, you must call back to this facility for assistance.  SEEK IMMEDIATE MEDICAL CARE IF:   You or your child is unable to keep fluids down or other symptoms or problems become worse in spite of treatment.   Vomiting or diarrhea develops and becomes persistent.     There is vomiting of blood or bile (green material).   There is blood in the stool or the stools are black and tarry.   There is no urine output in 6-8 hours or there is only a small amount of very dark urine.   Abdominal pain develops, increases or localizes.   You have a fever.   Your baby is older than 3 months with a  rectal temperature of 102 F (38.9 C) or higher.   Your baby is 63 months old or younger with a rectal temperature of 100.4 F (38 C) or higher.   You or your child develops excessive weakness, dizziness, fainting or extreme thirst.   You or your child develops a rash, stiff neck, severe headache or become irritable or sleepy and difficult to awaken.  MAKE SURE YOU:   Understand these instructions.   Will watch your condition.   Will get help right away if you are not doing well or get worse.  Document Released: 07/01/2002 Document Revised: 03/23/2011 Document Reviewed: 05/18/2009 [facility] Patient Information 2012 [place], [place].  Try over the counter Imodium for diarrhea.

## 2011-07-05 NOTE — Progress Notes (Signed)
Quick Note:  Pt informed on VM ______ 

## 2011-07-11 ENCOUNTER — Encounter: Payer: Self-pay | Admitting: Gastroenterology

## 2011-07-11 ENCOUNTER — Other Ambulatory Visit: Payer: Medicare HMO

## 2011-07-11 ENCOUNTER — Ambulatory Visit (INDEPENDENT_AMBULATORY_CARE_PROVIDER_SITE_OTHER): Payer: Medicare HMO | Admitting: Gastroenterology

## 2011-07-11 VITALS — BP 110/70 | HR 84 | Ht 69.0 in | Wt 169.0 lb

## 2011-07-11 DIAGNOSIS — R197 Diarrhea, unspecified: Secondary | ICD-10-CM

## 2011-07-11 DIAGNOSIS — Z800 Family history of malignant neoplasm of digestive organs: Secondary | ICD-10-CM

## 2011-07-11 MED ORDER — PEG-KCL-NACL-NASULF-NA ASC-C 100 G PO SOLR: 1.0000 | ORAL | Status: DC

## 2011-07-11 NOTE — Progress Notes (Signed)
History of Present Illness: This is a 55 year old female who relates the acute onset of diarrhea 3 weeks ago crampy lower abdominal pain. She has urgency, watery, nonbloody diarrhea after each meal. She was evaluated by Dr. Lina Sar in the remote past and was diagnosed with irritable bowel syndrome. She states she was evaluated by Dr. Arty Baumgartner underwent colonoscopy around 2001 when her mother was diagnosed with colon cancer. She was followed by a gastroenterologist at [facility] and underwent her last colonoscopy around 2006. She states her prior colonoscopies were all normal. Unfortunately do not have records from her prior GI evaluations. She states she generally has constipation with occasional lower abdominal discomfort as her typical bowel pattern. She denies any recent antibiotic usage, diet changes, medication changes were unusual travel history. Denies weight loss, change in stool caliber, melena, hematochezia, nausea, vomiting, dysphagia, reflux symptoms, chest pain.  Review of Systems: Pertinent positive and negative review of systems were noted in the above HPI section. All other review of systems were otherwise negative.  Current Medications, Allergies, Past Medical History, Past Surgical History, Family History and Social History were reviewed in [employer] record.  Physical Exam: General: Well developed , well nourished, no acute distress Head: Normocephalic and atraumatic Eyes:  sclerae anicteric, EOMI Ears: Normal auditory acuity Mouth: No deformity or lesions Neck: Supple, no masses or thyromegaly Lungs: Clear throughout to auscultation Heart: Regular rate and rhythm; no murmurs, rubs or bruits Abdomen: Soft, non tender and non distended. No masses, hepatosplenomegaly or hernias noted. Normal Bowel sounds Rectal: , No lesions, brown and Hemoccult negative loose stool  Musculoskeletal: Symmetrical with no gross deformities  Skin: No  lesions on visible extremities Pulses:  Normal pulses noted Extremities: No clubbing, cyanosis, edema or deformities noted Neurological: Alert oriented x 4, grossly nonfocal Cervical Nodes:  No significant cervical adenopathy Inguinal Nodes: No significant inguinal adenopathy Psychological:  Alert and cooperative. Normal mood and affect  Assessment and Recommendations:  1. Diarrhea. Rule out infectious etiologies. Standard stool studies and if all negative consider an empiric trial of Cipro and Flagyl. Use Imodium twice a day when necessary and BRATT diet for now.  2. Family history of colon cancer in her mother at age 50. Patient states she is about one year overdue for a five-year screening colonoscopy.The risks, benefits, and alternatives to colonoscopy with possible biopsy and possible polypectomy were discussed with the patient and they consent to proceed.

## 2011-07-11 NOTE — Patient Instructions (Signed)
You have been scheduled for a Colonoscopy. Your prep has been sent to the pharmacy. You will have stool test today, please head down to the basement for the stool kits.

## 2011-07-12 LAB — OVA AND PARASITE SCREEN: OP: NONE SEEN

## 2011-07-15 LAB — STOOL CULTURE

## 2011-07-26 DIAGNOSIS — K52832 Lymphocytic colitis: Secondary | ICD-10-CM

## 2011-07-26 HISTORY — DX: Lymphocytic colitis: K52.832

## 2011-08-05 ENCOUNTER — Ambulatory Visit (AMBULATORY_SURGERY_CENTER): Payer: Medicare HMO | Admitting: Gastroenterology

## 2011-08-05 ENCOUNTER — Encounter: Payer: Self-pay | Admitting: Gastroenterology

## 2011-08-05 DIAGNOSIS — K5289 Other specified noninfective gastroenteritis and colitis: Secondary | ICD-10-CM

## 2011-08-05 DIAGNOSIS — R197 Diarrhea, unspecified: Secondary | ICD-10-CM

## 2011-08-05 DIAGNOSIS — Z1211 Encounter for screening for malignant neoplasm of colon: Secondary | ICD-10-CM

## 2011-08-05 DIAGNOSIS — Z800 Family history of malignant neoplasm of digestive organs: Secondary | ICD-10-CM

## 2011-08-05 MED ORDER — SODIUM CHLORIDE 0.9 % IV SOLN: 500.0000 mL | INTRAVENOUS | Status: DC

## 2011-08-05 NOTE — Progress Notes (Signed)
At discharge patient had no complaints.

## 2011-08-05 NOTE — Progress Notes (Signed)
Patient did not experience any of the following events: a burn prior to discharge; a fall within the facility; wrong site/side/patient/procedure/implant event; or a hospital transfer or hospital admission upon discharge from the facility. (G8907) Patient did not have preoperative order for IV antibiotic SSI prophylaxis. (G8918)  

## 2011-08-05 NOTE — Op Note (Signed)
[facility] [address] [place], [place]  [address]  COLONOSCOPY PROCEDURE REPORT PATIENT:  Michelle Jensen, Michelle Jensen  MR#:  [id number] BIRTHDATE:  03-30-1956, 55 yrs. old  GENDER:  female ENDOSCOPIST:  Judie Petit T. Russella Dar, MD, [facility] Referred by:  Evelena Peat, M.D. PROCEDURE DATE:  08/05/2011 PROCEDURE:  Colonoscopy with biopsy ASA CLASS:  Class II INDICATIONS:  1) Elevated Risk Screening  2) unexplained diarrhea 3) family history of colon cancer: mother at 59. MEDICATIONS:   These medications were titrated to patient response per physician's verbal order, Fentanyl 100 mcg IV, Versed 10 mg IV DESCRIPTION OF PROCEDURE:   After the risks benefits and alternatives of the procedure were thoroughly explained, informed consent was obtained.  Digital rectal exam was performed and revealed no abnormalities.   The LB 180AL [id number] endoscope was introduced through the anus and advanced to the terminal ileum which was intubated for a short distance, without limitations. The quality of the prep was excellent, using MoviPrep.  The instrument was then slowly withdrawn as the colon was fully examined. <<PROCEDUREIMAGES>> FINDINGS:  Mild diverticulosis was found in the sigmoid colon. Otherwise normal colonoscopy without other polyps, masses, vascular ectasias, or inflammatory changes. Random biopsies were obtained and sent to pathology.  The terminal ileum appeared normal. Retroflexed views in the rectum revealed no abnormalities.  The time to cecum =  3.25  minutes. The scope was then withdrawn (time =  8.5  min) from the patient and the procedure completed.  COMPLICATIONS:  None  ENDOSCOPIC IMPRESSION: 1) Mild diverticulosis in the sigmoid colon 2) Normal terminal ileum  RECOMMENDATIONS: 1) High fiber diet with liberal fluid intake. 2) Await pathology results 3) Repeat Colonoscopy in 5 years. 4) Out patient follow-up in 3-4 weeks.  Venita Lick. Russella Dar, MD, [facility]  n. eSIGNED:   Venita Lick. Stark at 08/05/2011 02:30 PM  Alfonso Patten, [phone]

## 2011-08-05 NOTE — Progress Notes (Signed)
Pt's temp- 99.8.  She states that yesterday evening she started feeling aching, congested with a cough.  He brother (who she lives with) is sick at home.  Dr. Russella Dar made aware and both he and pt are agreeable to do the colonoscopy.  Dr. Russella Dar requests the pt wear a mask and pt is agreeable to this.

## 2011-08-05 NOTE — Patient Instructions (Signed)
Mild diverticulosis seen today. Try to follow high fiber diet with liberal fluid intake. See handouts given. Biopsies taken, you will receive result letter in your mail in 1-2 weeks. Repeat colonoscopy in 5 years. Follow up with Dr.Stark in office in 3-4 weeks. Dr.Stark's office nurse will call you next week with appointment date. Resume current medications. Call us with any questions or concerns. Thank you!!

## 2011-08-08 ENCOUNTER — Telehealth: Payer: Self-pay | Admitting: *Deleted

## 2011-08-08 NOTE — Telephone Encounter (Signed)
Message left

## 2011-08-09 ENCOUNTER — Encounter: Payer: Self-pay | Admitting: Gastroenterology

## 2011-08-09 ENCOUNTER — Telehealth: Payer: Self-pay

## 2011-08-09 NOTE — Telephone Encounter (Signed)
Left message for patient to call back  

## 2011-08-09 NOTE — Telephone Encounter (Signed)
Message copied by Annett Fabian on Tue Aug 09, 2011 10:23 AM ------      Message from: Claudette Head T      Created: Tue Aug 09, 2011 10:09 AM       Recent colon biopsies showed lymphocytic colitis. If she is still having diarrhea start budesonide 9 mg po qd with 3 months of refills and keep REV appt around 4-6 weeks

## 2011-08-10 ENCOUNTER — Other Ambulatory Visit: Payer: Self-pay

## 2011-08-10 MED ORDER — BUDESONIDE 3 MG PO CP24: 9.0000 mg | ORAL_CAPSULE | Freq: Every day | ORAL | Status: DC

## 2011-08-10 NOTE — Telephone Encounter (Signed)
Left message for patient to call back  

## 2011-08-10 NOTE — Telephone Encounter (Signed)
Patient reports she is still having diarrhea.  I have rescheduled her 08/24/11 appt to 08/12/11.  She is advised of the results and to start on rx.

## 2011-08-13 LAB — HM COLONOSCOPY: HM Colonoscopy: NEGATIVE

## 2011-08-24 ENCOUNTER — Ambulatory Visit: Payer: Medicare HMO | Admitting: Gastroenterology

## 2011-08-31 ENCOUNTER — Ambulatory Visit: Payer: Medicare HMO

## 2011-09-12 ENCOUNTER — Ambulatory Visit: Payer: Medicare HMO | Admitting: Gastroenterology

## 2011-09-20 ENCOUNTER — Ambulatory Visit: Payer: Medicare HMO

## 2011-10-10 ENCOUNTER — Encounter: Payer: Self-pay | Admitting: Gastroenterology

## 2011-10-10 ENCOUNTER — Ambulatory Visit (INDEPENDENT_AMBULATORY_CARE_PROVIDER_SITE_OTHER): Payer: Medicare HMO | Admitting: Gastroenterology

## 2011-10-10 VITALS — BP 118/64 | HR 76 | Ht 65.0 in | Wt 167.0 lb

## 2011-10-10 DIAGNOSIS — Z800 Family history of malignant neoplasm of digestive organs: Secondary | ICD-10-CM

## 2011-10-10 DIAGNOSIS — K5289 Other specified noninfective gastroenteritis and colitis: Secondary | ICD-10-CM

## 2011-10-10 NOTE — Progress Notes (Signed)
History of Present Illness: This is a 56 year old female returning for followup of recently diagnosed lymphocytic colitis. Her diarrhea completely resolved while on Entocort and she's been off Entocort now for 2-3 weeks with no recurrence of diarrhea. Her typical bowel pattern of mild constipation has returned.  Current Medications, Allergies, Past Medical History, Past Surgical History, Family History and Social History were reviewed in [employer] record.  Physical Exam: General: Well developed , well nourished, no acute distress Head: Normocephalic and atraumatic Eyes:  sclerae anicteric, EOMI Ears: Normal auditory acuity Mouth: No deformity or lesions Lungs: Clear throughout to auscultation Heart: Regular rate and rhythm; no murmurs, rubs or bruits Abdomen: Soft, non tender and non distended. No masses, hepatosplenomegaly or hernias noted. Normal Bowel sounds Musculoskeletal: Symmetrical with no gross deformities  Extremities: No clubbing, cyanosis, edema or deformities noted Neurological: Alert oriented x 4, grossly nonfocal Psychological:  Alert and cooperative. Normal mood and affect  Assessment and Recommendations:  1. Lymphocytic colitis. Diarrhea resolved following a course of Entocort. Discussed lymphocytic colitis and retreat if her symptoms recur. She states she found information on the Internet that lymphocytic colitis might be associated with Zoloft usage. Although NSAIDs are listed as a potential associated medication I cannot find any other medications in Up-to-Date and are clearly associated. She requests a family medical leave paperwork be filled out in case her diarrhea returns. I advised her to use Imodium and then contact us for Entocort and followup if her diarrhea recurs.  2. Family history of colon cancer in her mother. Plan for surveillance colonoscopy in January 2018.

## 2011-10-10 NOTE — Patient Instructions (Signed)
cc: Evelena Peat, MD

## 2011-11-15 ENCOUNTER — Other Ambulatory Visit: Payer: Self-pay | Admitting: Family Medicine

## 2012-03-02 ENCOUNTER — Ambulatory Visit (INDEPENDENT_AMBULATORY_CARE_PROVIDER_SITE_OTHER): Payer: Medicare HMO | Admitting: Gastroenterology

## 2012-03-02 ENCOUNTER — Encounter: Payer: Self-pay | Admitting: Gastroenterology

## 2012-03-02 VITALS — BP 94/60 | HR 80 | Ht 65.5 in | Wt 164.4 lb

## 2012-03-02 DIAGNOSIS — K589 Irritable bowel syndrome without diarrhea: Secondary | ICD-10-CM

## 2012-03-02 DIAGNOSIS — R1013 Epigastric pain: Secondary | ICD-10-CM

## 2012-03-02 DIAGNOSIS — K219 Gastro-esophageal reflux disease without esophagitis: Secondary | ICD-10-CM

## 2012-03-02 MED ORDER — OMEPRAZOLE 40 MG PO CPDR: 40.0000 mg | DELAYED_RELEASE_CAPSULE | Freq: Every day | ORAL | Status: DC

## 2012-03-02 MED ORDER — GLYCOPYRROLATE 1 MG PO TABS: 1.0000 mg | ORAL_TABLET | Freq: Two times a day (BID) | ORAL | Status: DC

## 2012-03-02 NOTE — Progress Notes (Signed)
History of Present Illness: This is a 56 year old female who relates problems with intermittent urgent diarrhea that lasts for 1 to 2 days at a time and worsening epigastric pain associated with heartburn. Her epigastric pain and heartburn are occasionally helped with Zantac when necessary. She's been off budesonide for several months. She has not had any episodes diarrhea lasting more than 2 days. She states she is under a significant amount of stress as is very difficult for her work when she has diarrhea. She becomes anxious just thinking about having diarrhea problems when working.  Current Medications, Allergies, Past Medical History, Past Surgical History, Family History and Social History were reviewed in [employer] record.  Physical Exam: General: Well developed , well nourished, no acute distress Head: Normocephalic and atraumatic Eyes:  sclerae anicteric, EOMI Ears: Normal auditory acuity Mouth: No deformity or lesions Lungs: Clear throughout to auscultation Heart: Regular rate and rhythm; no murmurs, rubs or bruits Abdomen: Soft, mild epigastric tenderness without rebound or guarding and non distended. No masses, hepatosplenomegaly or hernias noted. Normal Bowel sounds Musculoskeletal: Symmetrical with no gross deformities  Pulses:  Normal pulses noted Extremities: No clubbing, cyanosis, edema or deformities noted Neurological: Alert oriented x 4, grossly nonfocal Psychological:  Alert and cooperative. Anxious  Assessment and Recommendations:  1. Lymphocytic colitis. Symptoms currently inactive. Retreatment budesonide if she has diarrhea lasting for 1 to 2 weeks.  2. Intermittent diarrhea. Symptoms are typical for irritable bowel syndrome and have been associated with stress at work. Glycopyrrolate 1 mg twice a day as needed. I advised to use glycopyrrolate for 1-2 days before she flies and throughout the several day work cycle.  3. Epigastric pain and  reflux symptoms. I suspect this is GERD. Rule out ulcer disease. 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 Begin standard antireflux measures and omeprazole 40 mg daily. The risks, benefits, and alternatives to endoscopy with possible biopsy and possible dilation were discussed with the patient and they consent to proceed.   4. Anxiety. Exacerbating or causing some of her symptoms. I've advised her to return to Dr. Caryl Never for further evaluation and treatment.

## 2012-03-02 NOTE — Patient Instructions (Addendum)
You have been scheduled for an endoscopy with propofol. Please follow written instructions given to you at your visit today. If you use inhalers (even only as needed), please bring them with you on the day of your procedure. Patient advised to avoid spicy, acidic, citrus, chocolate, mints, fruit and fruit juices.  Limit the intake of caffeine, alcohol and Soda.  Don't exercise too soon after eating.  Don't lie down within 3-4 hours of eating.  Elevate the head of your bed. We have sent the following medications to your pharmacy for you to pick up at your convenience: Omeprazole, Robinul. cc: Evelena Peat, MD

## 2012-04-02 ENCOUNTER — Ambulatory Visit (AMBULATORY_SURGERY_CENTER): Payer: Medicare HMO | Admitting: Gastroenterology

## 2012-04-02 ENCOUNTER — Encounter: Payer: Self-pay | Admitting: Gastroenterology

## 2012-04-02 VITALS — BP 138/83 | HR 72 | Temp 97.1°F | Resp 18 | Ht 65.5 in | Wt 164.0 lb

## 2012-04-02 DIAGNOSIS — K2970 Gastritis, unspecified, without bleeding: Secondary | ICD-10-CM

## 2012-04-02 DIAGNOSIS — K219 Gastro-esophageal reflux disease without esophagitis: Secondary | ICD-10-CM

## 2012-04-02 DIAGNOSIS — R1013 Epigastric pain: Secondary | ICD-10-CM

## 2012-04-02 DIAGNOSIS — K2990 Gastroduodenitis, unspecified, without bleeding: Secondary | ICD-10-CM

## 2012-04-02 MED ORDER — SODIUM CHLORIDE 0.9 % IV SOLN: 500.0000 mL | INTRAVENOUS | Status: DC

## 2012-04-02 NOTE — Patient Instructions (Addendum)
YOU HAD AN ENDOSCOPIC PROCEDURE TODAY AT THE [facility]: Refer to the procedure report that was given to you for any specific questions about what was found during the examination.  If the procedure report does not answer your questions, please call your gastroenterologist to clarify.  If you requested that your care partner not be given the details of your procedure findings, then the procedure report has been included in a sealed envelope for you to review at your convenience later.  YOU SHOULD EXPECT: Some feelings of bloating in the abdomen. Passage of more gas than usual.  Walking can help get rid of the air that was put into your GI tract during the procedure and reduce the bloating. If you had a lower endoscopy (such as a colonoscopy or flexible sigmoidoscopy) you may notice spotting of blood in your stool or on the toilet paper. If you underwent a bowel prep for your procedure, then you may not have a normal bowel movement for a few days.  DIET: Your first meal following the procedure should be a light meal and then it is ok to progress to your normal diet.  A half-sandwich or bowl of soup is an example of a good first meal.  Heavy or fried foods are harder to digest and may make you feel nauseous or bloated.  Likewise meals heavy in dairy and vegetables can cause extra gas to form and this can also increase the bloating.  Drink plenty of fluids but you should avoid alcoholic beverages for 24 hours.  ACTIVITY: Your care partner should take you home directly after the procedure.  You should plan to take it easy, moving slowly for the rest of the day.  You can resume normal activity the day after the procedure however you should NOT DRIVE or use heavy machinery for 24 hours (because of the sedation medicines used during the test).    SYMPTOMS TO REPORT IMMEDIATELY: A gastroenterologist can be reached at any hour.  During normal business hours, 8:30 AM to 5:00 PM Monday through Friday,  call [phone].  After hours and on weekends, please call the GI answering service at [phone] who will take a message and have the physician on call contact you.    Following upper endoscopy (EGD)  Vomiting of blood or coffee ground material  New chest pain or pain under the shoulder blades  Painful or persistently difficult swallowing  New shortness of breath  Fever of 100F or higher  Black, tarry-looking stools  FOLLOW UP: If any biopsies were taken you will be contacted by phone or by letter within the next 1-3 weeks.  Call your gastroenterologist if you have not heard about the biopsies in 3 weeks.  Our staff will call the home number listed on your records the next business day following your procedure to check on you and address any questions or concerns that you may have at that time regarding the information given to you following your procedure. This is a courtesy call and so if there is no answer at the home number and we have not heard from you through the emergency physician on call, we will assume that you have returned to your regular daily activities without incident.  SIGNATURES/CONFIDENTIALITY: You and/or your care partner have signed paperwork which will be entered into your electronic medical record.  These signatures attest to the fact that that the information above on your After Visit Summary has been reviewed and is understood.  Full   responsibility of the confidentiality of this discharge information lies with you and/or your care-partner.   Resume medications. Information given on gastritis with discharge instructions. 

## 2012-04-02 NOTE — Progress Notes (Signed)
Patient did not experience any of the following events: a burn prior to discharge; a fall within the facility; wrong site/side/patient/procedure/implant event; or a hospital transfer or hospital admission upon discharge from the facility. (G8907) Patient did not have preoperative order for IV antibiotic SSI prophylaxis. (G8918)  

## 2012-04-02 NOTE — Op Note (Signed)
 [facility] [address] [place], [address]   ENDOSCOPY PROCEDURE REPORT  PATIENT: Michelle, Jensen  MR#: [id number] BIRTHDATE: 29-Oct-1955 , 55  yrs. old GENDER: Female ENDOSCOPIST: Meryl Dare, MD, [facility]  PROCEDURE DATE:  04/02/2012 PROCEDURE:  EGD w/ biopsy ASA CLASS:     Class II INDICATIONS:  epigastric pain, history of esophageal reflux. MEDICATIONS: MAC sedation, administered by CRNA and propofol (Diprivan) 150mg  IV TOPICAL ANESTHETIC: Cetacaine Spray DESCRIPTION OF PROCEDURE: After the risks benefits and alternatives of the procedure were thoroughly explained, informed consent was obtained.  The LB GIF-H180 [id number] endoscope was introduced through the mouth and advanced to the second portion of the duodenum. Without limitations.  The instrument was slowly withdrawn as the mucosa was fully examined.   STOMACH: Mild gastritis (inflammation) was found in the entire examined stomach. There was granular patchy erythema. Multiple biopsies were performed using cold forceps.  The stomach otherwise appeared normal. ESOPHAGUS: The mucosa of the esophagus appeared normal. DUODENUM: The duodenal mucosa showed no abnormalities in the bulb and second portion of the duodenum. Retroflexed views revealed no abnormalities.  The scope was then withdrawn from the patient and the procedure completed.  COMPLICATIONS: There were no complications.  ENDOSCOPIC IMPRESSION: 1.   Gastritis (inflammation) was found in the entire examined stomach; multiple biopsies  RECOMMENDATIONS: 1.  anti-reflux regimen 2.  await pathology results 3.  continue PPI    eSigned:  Meryl Dare, MD, [facility] 04/02/2012 3:30 PM

## 2012-04-03 ENCOUNTER — Telehealth: Payer: Self-pay

## 2012-04-03 NOTE — Telephone Encounter (Signed)
Left message

## 2012-04-05 ENCOUNTER — Encounter: Payer: Self-pay | Admitting: Gastroenterology

## 2012-08-29 ENCOUNTER — Ambulatory Visit: Payer: Medicare HMO | Admitting: Family

## 2012-08-30 ENCOUNTER — Ambulatory Visit: Payer: Medicare HMO | Admitting: Family Medicine

## 2012-08-31 ENCOUNTER — Encounter: Payer: Self-pay | Admitting: Family Medicine

## 2012-08-31 ENCOUNTER — Ambulatory Visit (INDEPENDENT_AMBULATORY_CARE_PROVIDER_SITE_OTHER): Payer: Medicare HMO | Admitting: Family Medicine

## 2012-08-31 VITALS — Temp 98.2°F | Wt 168.0 lb

## 2012-08-31 DIAGNOSIS — N644 Mastodynia: Secondary | ICD-10-CM

## 2012-08-31 NOTE — Patient Instructions (Signed)
-  We placed a referral for imaging of your breast as as discussed.  If you have not heard about these visits in 1 week call our office.  -follow up with your doctor in 1 month

## 2012-08-31 NOTE — Progress Notes (Signed)
Chief Complaint  Patient presents with  . Breast Pain    left    HPI:  Acute visit for Breast Pain: -3-4 months pain in L lateral breast -she has not felt any lump or anything - reports breast are always knotty her whole life -last mammo 3 years ago -no fevers, chills, weight loss, drainage from breasts, no new exercises -she does self breast exams and hasn't noticed any changes in breast tissue -she does not have any breast cancer in the family except for one aunt -reports hx of mammograms at [facility] 3 years ago -reports menopause 3 years ago  ROS: See pertinent positives and negatives per HPI.  Past Medical History  Diagnosis Date  . Depression   . IBS (irritable bowel syndrome)   . Anxiety   . Lymphocytic colitis 07/2011  . Diverticulosis     Family History  Problem Relation Age of Onset  . Colon cancer Mother   . Rectal cancer Mother   . Drug abuse Sister   . Diabetes Paternal Grandmother   . Esophageal cancer Neg Hx   . Stomach cancer Neg Hx     History   Social History  . Marital Status: Married    Spouse Name: N/A    Number of Children: N/A  . Years of Education: N/A   Social History Main Topics  . Smoking status: Former Smoker -- 0.3 packs/day for 10 years    Types: Cigarettes    Quit date: 10/31/2004  . Smokeless tobacco: Never Used  . Alcohol Use: Yes  . Drug Use: No  . Sexually Active: None   Other Topics Concern  . None   Social History Narrative   Occupation: Engineer, technical sales    Current outpatient prescriptions:glycopyrrolate (ROBINUL) 1 MG tablet, Take 1 tablet (1 mg total) by mouth 2 (two) times daily., Disp: 60 tablet, Rfl: 11;  Nutritional Supplements (MELATONIN PO), Take 1 tablet by mouth as needed., Disp: , Rfl: ;  Prenatal Vit-Fe Fumarate-FA (PRENATAL PO), Take by mouth daily. gummies, Disp: , Rfl: ;  ranitidine (ZANTAC) 150 MG tablet, Take 150 mg by mouth as needed., Disp: , Rfl:  sertraline (ZOLOFT) 100 MG tablet, Take 100 mg by mouth  daily. , Disp: , Rfl: ;  LYSINE PO, Take by mouth as directed., Disp: , Rfl: ;  omeprazole (PRILOSEC) 40 MG capsule, Take 1 capsule (40 mg total) by mouth daily., Disp: 30 capsule, Rfl: 11  EXAM:  Filed Vitals:   08/31/12 1329  Temp: 98.2 F (36.8 C)    There is no height on file to calculate BMI.  GENERAL: vitals reviewed and listed above, alert, oriented, appears well hydrated and in no acute distress  HEENT: atraumatic, conjunttiva clear, no obvious abnormalities on inspection of external nose and ears  NECK: no obvious masses on inspection  BREAST: normal appearance of breasts and nipples, no discharge or abnormal skin appearance, scattered soft hyperdense breast with TTP throughout bilaterally, no firm or fixed nodules appreciated, no LAD appreciated  MS: moves all extremities without noticeable abnormality  PSYCH: pleasant and cooperative, no obvious depression or anxiety  ASSESSMENT AND PLAN:  Discussed the following assessment and plan:  1. Breast pain  MM Digital Diagnostic Unilat L, MM Digital Screening   -no suspicious lesions appreciated on exam, but pt with dense breasts. Will get bilat mammo, with diagnostic L breast mammo. Ordered stat as pt very concerned. Pt to follow up with PCP for CPE.  -Patient advised to return or notify a  doctor immediately if symptoms worsen or persist or new concerns arise.  There are no Patient Instructions on file for this visit.   Colin Benton R.

## 2012-09-19 ENCOUNTER — Other Ambulatory Visit: Payer: Medicare HMO

## 2012-09-24 ENCOUNTER — Encounter: Payer: Medicare HMO | Admitting: Family Medicine

## 2012-10-02 ENCOUNTER — Ambulatory Visit
Admission: RE | Admit: 2012-10-02 | Discharge: 2012-10-02 | Disposition: A | Discharge status: Home / Self Care | Payer: Medicare HMO | Source: Ambulatory Visit | Attending: Family Medicine | Admitting: Family Medicine

## 2012-10-03 ENCOUNTER — Other Ambulatory Visit (INDEPENDENT_AMBULATORY_CARE_PROVIDER_SITE_OTHER): Payer: Private Health Insurance - Indemnity

## 2012-10-03 DIAGNOSIS — Z0000 Encounter for general adult medical examination without abnormal findings: Secondary | ICD-10-CM

## 2012-10-03 LAB — POCT URINALYSIS DIPSTICK
Bilirubin, UA: NEGATIVE
Blood, UA: NEGATIVE
Glucose, UA: NEGATIVE
Nitrite, UA: NEGATIVE
Spec Grav, UA: 1.02
Urobilinogen, UA: 0.2

## 2012-10-03 LAB — BASIC METABOLIC PANEL
BUN: 11 mg/dL (ref 6–23)
Creatinine, Ser: 0.9 mg/dL (ref 0.4–1.2)
GFR: 73.45 mL/min (ref >60.00)
Glucose, Bld: 90 mg/dL (ref 70–99)
Potassium: 4.4 mEq/L (ref 3.5–5.1)

## 2012-10-03 LAB — HEPATIC FUNCTION PANEL
ALT: 20 U/L (ref 0–35)
AST: 23 U/L (ref 0–37)
Albumin: 3.9 g/dL (ref 3.5–5.2)
Alkaline Phosphatase: 56 U/L (ref 39–117)
Total Protein: 6.9 g/dL (ref 6.0–8.3)

## 2012-10-03 LAB — CBC WITH DIFFERENTIAL/PLATELET
Basophils Relative: 0.4 % (ref 0.0–3.0)
Eosinophils Absolute: 0.1 10*3/uL (ref 0.0–0.7)
Eosinophils Relative: 1.9 % (ref 0.0–5.0)
HCT: 37 % (ref 36.0–46.0)
Hemoglobin: 12.6 g/dL (ref 12.0–15.0)
Lymphs Abs: 2.7 10*3/uL (ref 0.7–4.0)
MCHC: 33.9 g/dL (ref 30.0–36.0)
MCV: 92.4 fl (ref 78.0–100.0)
Monocytes Absolute: 0.7 10*3/uL (ref 0.1–1.0)
Neutro Abs: 4.3 10*3/uL (ref 1.4–7.7)
RBC: 4.01 Mil/uL (ref 3.87–5.11)
WBC: 7.9 10*3/uL (ref 4.5–10.5)

## 2012-10-03 LAB — LIPID PANEL
Cholesterol: 264 mg/dL — ABNORMAL HIGH (ref 0–200)
VLDL: 34.6 mg/dL (ref 0.0–40.0)

## 2012-10-10 ENCOUNTER — Ambulatory Visit (INDEPENDENT_AMBULATORY_CARE_PROVIDER_SITE_OTHER): Payer: Private Health Insurance - Indemnity | Admitting: Family Medicine

## 2012-10-10 ENCOUNTER — Encounter: Payer: Self-pay | Admitting: Family Medicine

## 2012-10-10 VITALS — BP 110/80 | HR 80 | Temp 98.1°F | Resp 12 | Ht 65.0 in | Wt 169.0 lb

## 2012-10-10 DIAGNOSIS — L989 Disorder of the skin and subcutaneous tissue, unspecified: Secondary | ICD-10-CM

## 2012-10-10 NOTE — Progress Notes (Addendum)
Subjective:    Patient ID: Michelle Jensen, female    DOB: [birth year], 57 y.o.   MRN: [id number]  HPI Patient here for complete physical. Immunizations are up to date. Had colonoscopy last year. Patient getting yearly mammograms. Last Pap smear was 2012 and she is low risk.  She had recent recurrent herpes flare right buttock region. Treated with Valtrex. She's had several weeks of right lower lumbar back pain and right radiculopathy pain. Past week she has noted weakness with climbing up steps at a couple times her right lower extremity "giving way". She has occasional numbness right thigh. No loss of urine or stool control. No appetite or weight changes.  Past Medical History  Diagnosis Date  . Depression   . IBS (irritable bowel syndrome)   . Anxiety   . Lymphocytic colitis 07/2011  . Diverticulosis    Past Surgical History  Procedure Laterality Date  . Arthroscopic repair acl      rt knee  . Ganglion cyst excision      rt wrist    reports that she quit smoking about 7 years ago. Her smoking use included Cigarettes. She has a 3 pack-year smoking history. She has never used smokeless tobacco. She reports that  drinks alcohol. She reports that she does not use illicit drugs. family history includes Colon cancer in her mother; Diabetes in her paternal grandmother; Drug abuse in her sister; and Rectal cancer in her mother.  There is no history of Esophageal cancer and Stomach cancer. No Known Allergies    Review of Systems  Constitutional: Negative for fever, activity change, appetite change, fatigue and unexpected weight change.  HENT: Negative for hearing loss, ear pain, sore throat and trouble swallowing.   Eyes: Negative for visual disturbance.  Respiratory: Negative for cough and shortness of breath.   Cardiovascular: Negative for chest pain and palpitations.  Gastrointestinal: Negative for abdominal pain, diarrhea, constipation and blood in stool.  Endocrine: Negative  for polyuria.  Genitourinary: Negative for dysuria and hematuria.  Musculoskeletal: Positive for back pain. Negative for myalgias and arthralgias.  Skin: Negative for rash.  Neurological: Positive for weakness. Negative for dizziness, syncope and headaches.  Hematological: Negative for adenopathy.  Psychiatric/Behavioral: Negative for confusion and dysphoric mood.       Objective:   Physical Exam  Constitutional: She is oriented to person, place, and time. She appears well-developed and well-nourished.  HENT:  Head: Normocephalic and atraumatic.  Eyes: EOM are normal. Pupils are equal, round, and reactive to light.  Neck: Normal range of motion. Neck supple. No thyromegaly present.  Cardiovascular: Normal rate, regular rhythm and normal heart sounds.   No murmur heard. Pulmonary/Chest: Breath sounds normal. No respiratory distress. She has no wheezes. She has no rales.  Abdominal: Soft. Bowel sounds are normal. She exhibits no distension and no mass. There is no tenderness. There is no rebound and no guarding.  Musculoskeletal: Normal range of motion. She exhibits no edema.  Straight leg raise is negative  Lymphadenopathy:    She has no cervical adenopathy.  Neurological: She is alert and oriented to person, place, and time. No cranial nerve deficit.  Patient has diminished right knee reflex compared to left. Ankle reflexes are symmetric. She has weakness with right knee extension compared to left. Full plantar flexion dorsiflexion bilaterally  Skin: No rash noted.  Left preauricular area slightly raised fleshy to faint brown nodular growth.  About 6-7 mm diameter.  Psychiatric: She has a normal mood and affect.  Her behavior is normal. Judgment and thought content normal.          Assessment & Plan:  #1 health maintenance. Labs reviewed with no significant concerns. Immunizations up-to-date. Last Pap smear less than 2 years ago and she elects to go to every 3 years as very low  risk. Continue yearly mammogram. Colonoscopy up to date.  #2 right lumbar radiculopathy symptoms. She has findings on exam of diminished knee reflex on the right and weakness with right knee extension. Given neurologic symptoms above go ahead MRI lumbar spine to further assess-rule of L3-4 nerve root involvement.  #3 Left facial lesion.  Rule out basal cell vs seb keratosis.  Evaluation per [facility] surgery center.  MRI reviewed with pt.  Comment of "disc herniations" at several levels but no clear explanation for her RLE pain and weakness.   With set up referral to neurosurgeon.

## 2012-10-10 NOTE — Patient Instructions (Addendum)

## 2012-10-12 ENCOUNTER — Telehealth: Payer: Self-pay | Admitting: Family Medicine

## 2012-10-12 NOTE — Telephone Encounter (Signed)
Ativan 1 mg one hour prior to procedure.

## 2012-10-12 NOTE — Telephone Encounter (Signed)
MRI scheduled for 3/25, pt claustrobic

## 2012-10-12 NOTE — Telephone Encounter (Signed)
Patient Information:  Caller Name: [name]  Phone: [phone]  Patient: Michelle Jensen, Michelle Jensen  Gender: Female  DOB: February 11, 1956  Age: 57 Years  PCP: Evelena Peat [facility])  Office Follow Up:  Does the office need to follow up with this patient?: Yes  Instructions For The Office: Scheduled for MRI on 3/15 at 08:30. Amour is claustrophobic. MRI office recommended Angie notify Dr. Caryl Never to have a pre-med be ordered for Librada to take prior to procedure.   Symptoms  Reason For Call & Symptoms: Joselle states she is scheduled for an MRI on 10/16/12 at 08:30. Is claustrophobic. MRI office advised Timmie to call Dr. Kern Alberta to see if a pre-med can be ordered. Jerlene uses [facility] at [phone]. PLEASE CALL Deicy AT [phone]. Thanks.  Reviewed Health History In EMR: Yes  Reviewed Medications In EMR: Yes  Reviewed Allergies In EMR: Yes  Reviewed Surgeries / Procedures: Yes  Date of Onset of Symptoms: 09/28/2012  Guideline(s) Used:  No Protocol Available - Information Only  Disposition Per Guideline:   Discuss with PCP and Callback by Nurse Today  Reason For Disposition Reached:   Nursing judgment  Advice Given:  Call Back If:  You become worse.  Patient Will Follow Care Advice:  YES

## 2012-10-15 MED ORDER — LORAZEPAM 1 MG PO TABS: ORAL_TABLET | ORAL | Status: DC

## 2012-10-15 NOTE — Telephone Encounter (Signed)
Pt informed on VM Rx called in

## 2012-10-16 ENCOUNTER — Ambulatory Visit
Admission: RE | Admit: 2012-10-16 | Discharge: 2012-10-16 | Disposition: A | Discharge status: Home / Self Care | Payer: Private Health Insurance - Indemnity | Source: Ambulatory Visit | Attending: Family Medicine | Admitting: Family Medicine

## 2012-10-16 DIAGNOSIS — M5416 Radiculopathy, lumbar region: Secondary | ICD-10-CM

## 2012-10-17 ENCOUNTER — Telehealth: Payer: Self-pay | Admitting: Family Medicine

## 2012-10-17 NOTE — Telephone Encounter (Addendum)
Pt is returning MD call concerning MRI results

## 2012-10-17 NOTE — Addendum Note (Signed)
Addended by: Kristian Covey on: 10/17/2012 08:03 PM   Modules accepted: Orders

## 2012-10-22 ENCOUNTER — Telehealth: Payer: Self-pay | Admitting: Family Medicine

## 2012-10-22 MED ORDER — HYDROCODONE-ACETAMINOPHEN 5-325 MG PO TABS: ORAL_TABLET | ORAL | Status: DC

## 2012-10-22 NOTE — Telephone Encounter (Signed)
Left message on machine for patient and Rx called in. 

## 2012-10-22 NOTE — Telephone Encounter (Signed)
Patient Information:  Caller Name: Karess  Phone: [phone]  Patient: Michelle Jensen, Michelle Jensen  Gender: Female  DOB: 12-15-1955  Age: 57 Years  PCP: Evelena Peat [facility])  Office Follow Up:  Does the office need to follow up with this patient?: Yes  Instructions For The Office: Patient declines appt. Patient is requesting pain medication to get her through until she is seen by the Neurosurgeon.  Patient uses [facility] on [address] at [phone]. Patient can be reached at [phone].  RN Note:  Patient states she has had pain radiating down buttock, hip and radiating down right leg, onset 10/04/12. Patient states she had Michelle Jensen MRI completed 10/16/12 which revealed "bulging discs."  Patient states she has has Michelle Jensen appt. scheduled with a Neurosurgeon 10/30/12. Patient states she has been taking Ibuprofen without relief. Patient states she took 1000mg . of Ibuprofen in a 4 hour period "last evening" without relief. Describes pain as stabbing. Urinating normally for patient. Patient states pain is currently at a "3" on 1-10 scale. Patient states pain increases and becomes unbearable at night. Patient advised not to take more than Ibuprofen 800mg ., with food, q 8 hours. Care advice given per guidelines. Call back parameters reviewed.  Patient declines appt. Patient is requesting pain medication to get her through until she is seen by the Neurosurgeon.  Patient uses [facility] on [address] at [phone]. Patient can be reached at [phone].  Symptoms  Reason For Call & Symptoms: pain right buttock radiating to back  Reviewed Health History In EMR: Yes  Reviewed Medications In EMR: Yes  Reviewed Allergies In EMR: Yes  Reviewed Surgeries / Procedures: Yes  Date of Onset of Symptoms: 10/04/2012  Treatments Tried: Ibuprofen  Treatments Tried Worked: No  Guideline(s) Used:  Back Pain  Disposition Per Guideline:   See Today or Tomorrow in  Office  Reason For Disposition Reached:   Pain radiates into the thigh or further down the leg  Advice Given:  Activity  Avoid anything that makes your pain worse. Avoid heavy lifting, twisting, and too much exercise until your back heals.  Call Back If:  Numbness or weakness occur  Bowel/bladder problems occur  You become worse.  Call Back If:  You have more questions  You become worse.  Patient Refused Recommendation:  Patient Requests Prescription  Patient declines appt. Patient is requesting pain medication to get her through until she is seen by the Neurosurgeon on 10/30/12.  Patient uses [facility] on [address] at [phone]. Patient can be reached at [phone].

## 2012-10-22 NOTE — Telephone Encounter (Signed)
Let's call in Vicodin 5/325 mg 1-2 po q 4-6 hours prn pain #40 with no refills.

## 2012-10-31 ENCOUNTER — Other Ambulatory Visit: Payer: Self-pay | Admitting: Neurosurgery

## 2012-10-31 DIAGNOSIS — M5416 Radiculopathy, lumbar region: Secondary | ICD-10-CM

## 2012-11-05 ENCOUNTER — Ambulatory Visit
Admission: RE | Admit: 2012-11-05 | Discharge: 2012-11-05 | Disposition: A | Discharge status: Home / Self Care | Payer: 59 | Source: Ambulatory Visit | Attending: Neurosurgery | Admitting: Neurosurgery

## 2012-11-05 VITALS — BP 107/65 | HR 66

## 2012-11-05 DIAGNOSIS — M5416 Radiculopathy, lumbar region: Secondary | ICD-10-CM

## 2012-11-05 MED ORDER — MEPERIDINE HCL 100 MG/ML IJ SOLN
75.0000 mg | Freq: Once | INTRAMUSCULAR | Status: AC
  Administered 2012-11-05: 75 mg via INTRAMUSCULAR

## 2012-11-05 MED ORDER — DIAZEPAM 5 MG PO TABS
10.0000 mg | ORAL_TABLET | Freq: Once | ORAL | Status: AC
  Administered 2012-11-05: 10 mg via ORAL

## 2012-11-05 MED ORDER — ONDANSETRON HCL 4 MG/2ML IJ SOLN
4.0000 mg | Freq: Once | INTRAMUSCULAR | Status: AC
  Administered 2012-11-05: 4 mg via INTRAMUSCULAR

## 2012-11-05 MED ORDER — IOHEXOL 180 MG/ML  SOLN
15.0000 mL | Freq: Once | INTRAMUSCULAR | Status: AC | PRN
  Administered 2012-11-05: 15 mL via INTRATHECAL

## 2012-11-05 NOTE — Progress Notes (Signed)
Patient states she has been off Zoloft for the past two days.  Discharge instructions explained to patient.  jkl

## 2012-12-17 ENCOUNTER — Other Ambulatory Visit: Payer: Self-pay | Admitting: Family Medicine

## 2013-01-16 ENCOUNTER — Ambulatory Visit: Payer: 59 | Admitting: Family Medicine

## 2013-01-23 ENCOUNTER — Ambulatory Visit: Payer: 59 | Admitting: Family Medicine

## 2013-01-23 DIAGNOSIS — Z0289 Encounter for other administrative examinations: Secondary | ICD-10-CM

## 2013-02-25 ENCOUNTER — Ambulatory Visit: Payer: 59 | Admitting: Family Medicine

## 2013-04-04 ENCOUNTER — Encounter: Payer: Self-pay | Admitting: Family Medicine

## 2013-04-04 ENCOUNTER — Ambulatory Visit (INDEPENDENT_AMBULATORY_CARE_PROVIDER_SITE_OTHER): Payer: 59 | Admitting: Family Medicine

## 2013-04-04 VITALS — BP 110/68 | HR 76 | Temp 98.1°F | Wt 167.0 lb

## 2013-04-04 DIAGNOSIS — F329 Major depressive disorder, single episode, unspecified: Secondary | ICD-10-CM

## 2013-04-04 DIAGNOSIS — G4700 Insomnia, unspecified: Secondary | ICD-10-CM

## 2013-04-04 MED ORDER — ARIPIPRAZOLE 2 MG PO TABS: 2.0000 mg | ORAL_TABLET | Freq: Every day | ORAL | Status: DC

## 2013-04-04 NOTE — Progress Notes (Signed)
  Subjective:    Patient ID: Michelle Jensen, female    DOB: 02-14-1956, 57 y.o.   MRN: [id number]  HPI Patient here for followup regarding recurrent depression. She has long history of depression going back many years. She currently takes sertraline 100 mg daily. She has been out of work since August 31 because of her depression symptoms. She has had sleep difficulties, depressed mood, anhedonia, decreased concentration, decreased initiative and energy.  She has been compliant with sertraline. Previously took Wellbutrin and Effexor and had side effects. She briefly took Abilify in combination with sertraline and she thinks this helped. She started counseling and had her first session earlier today. She plans to continue with that. She is requesting FMLA forms be completed. She's had difficulty with initiative and focus.  She denies any suicidal ideation. She has been consuming occasionally more wine than she realizes is healthy. Drinks anywhere from 2-4 glasses at night. She is having difficulty falling asleep and also some early morning awakening. She is starting to sleep more during the daytime.  Past Medical History  Diagnosis Date  . Depression   . IBS (irritable bowel syndrome)   . Anxiety   . Lymphocytic colitis 07/2011  . Diverticulosis    Past Surgical History  Procedure Laterality Date  . Arthroscopic repair acl      rt knee  . Ganglion cyst excision      rt wrist    reports that she quit smoking about 8 years ago. Her smoking use included Cigarettes. She has a 3 pack-year smoking history. She has never used smokeless tobacco. She reports that  drinks alcohol. She reports that she does not use illicit drugs. family history includes Colon cancer in her mother; Diabetes in her paternal grandmother; Drug abuse in her sister; Rectal cancer in her mother. There is no history of Esophageal cancer or Stomach cancer. No Known Allergies    Review of Systems  Constitutional: Negative  for appetite change and unexpected weight change.  Respiratory: Negative for cough and shortness of breath.   Cardiovascular: Negative for chest pain.  Neurological: Negative for dizziness.  Psychiatric/Behavioral: Positive for sleep disturbance and dysphoric mood. Negative for suicidal ideas, confusion and agitation. The patient is not nervous/anxious.        Objective:   Physical Exam  Constitutional: She appears well-developed and well-nourished.  Cardiovascular: Normal rate and regular rhythm.   Pulmonary/Chest: Effort normal and breath sounds normal. No respiratory distress. She has no wheezes. She has no rales.  Psychiatric: She has a normal mood and affect. Her behavior is normal. Judgment and thought content normal.          Assessment & Plan:  Recurrent depression. We discussed options including option of changing her from sertraline but we have decided to add Abilify 2 mg each bedtime. After one week consider titration to 4 mg. Continue counseling. Reassess 3 weeks. We discussed other things that may benefit such as starting back some regular exercise and trying to get connected and with some good social support. We spent over 25 minutes in counseling and assessment.  Sleep hygiene discussed

## 2013-04-05 ENCOUNTER — Encounter: Payer: Self-pay | Admitting: Family Medicine

## 2013-04-05 DIAGNOSIS — F331 Major depressive disorder, recurrent, moderate: Secondary | ICD-10-CM | POA: Insufficient documentation

## 2013-04-25 IMAGING — RF DG MYELOGRAM LUMBAR
13 of 21 series · 13 of 21 positions shown · IV contrast (omnipaque)
Comparison: MRI 10/16/2012.

MYELOGRAM INJECTION
TECHNIQUE: Informed consent was obtained from the patient prior to
the procedure, including potential complications of headache,
allergy, infection and pain. Specific instructions were given
regarding 24 hour bedrest post procedure to prevent post-LP
headache.  A timeout procedure was performed.  With the patient
prone, the lower back was prepped with Betadine.  1% Lidocaine was
used for local anesthesia.  Lumbar puncture was performed by the
radiologist at the L3-L4 level using a 22 gauge needle with return
of clear CSF. I personally performed the lumbar puncture and
administered the intrathecal contrast. I also personally supervised
acquisition of the myelogram images. 15 cc of Omnipaque 180 was
injected into the subarachnoid space .
CLINICAL DATA: Low back pain.  Right leg pain.
TECHNIQUE: Multidetector CT imaging of the lumbar spine was
performed following myelography.  Multiplanar CT image
reconstructions were also generated.

[Series 1: (hospital) · 1 of 1 slices shown (1 of 2)]
[im 1/1]
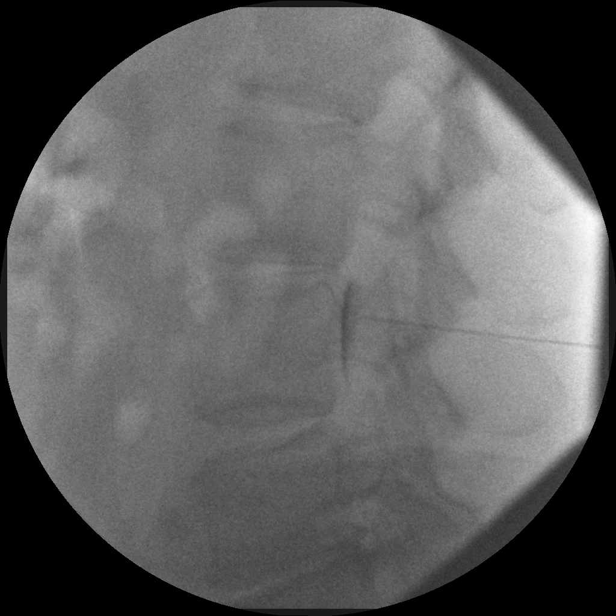

[Series 3: (hospital) · 1 of 1 slices shown (2 of 2)]
[im 1/1]
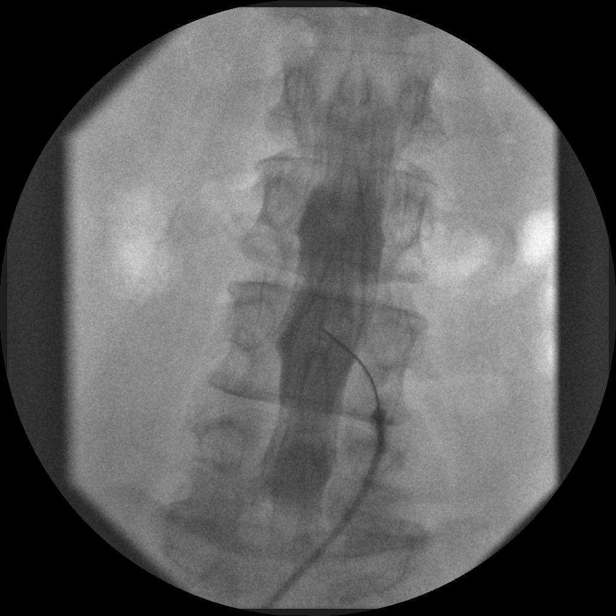

[Series 5: myelogram  white · 1 of 1 slices shown (1 of 9)]
[im 1/1]
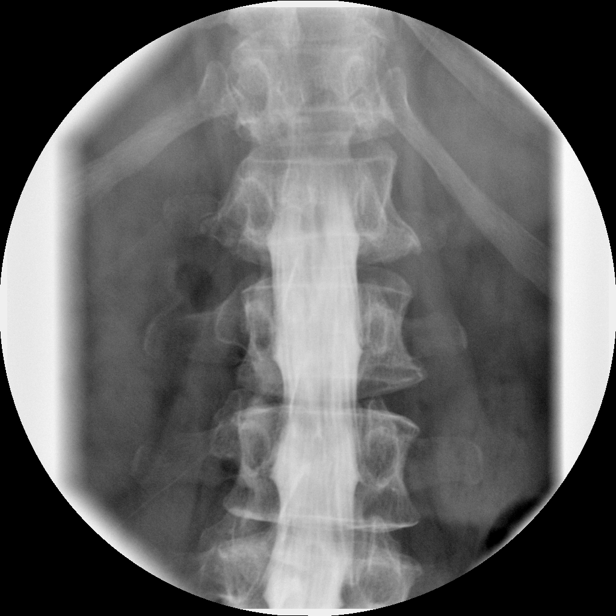

[Series 6: myelogram  white · 1 of 1 slices shown (2 of 9)]
[im 1/1]
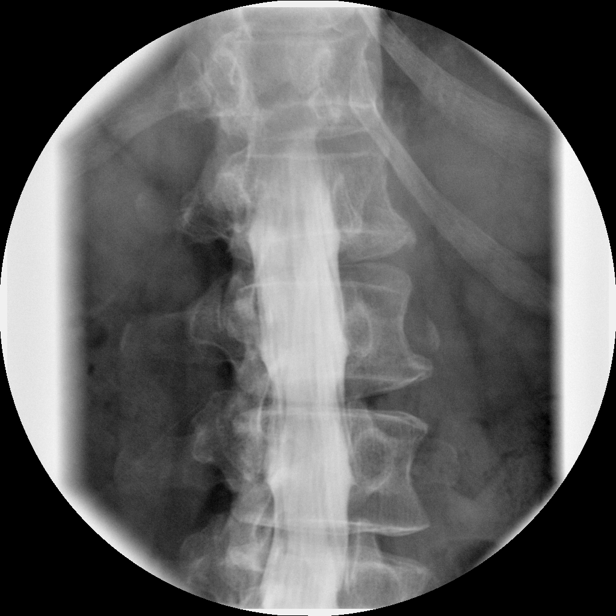

[Series 8: myelogram  white · 1 of 1 slices shown (3 of 9)]
[im 1/1]
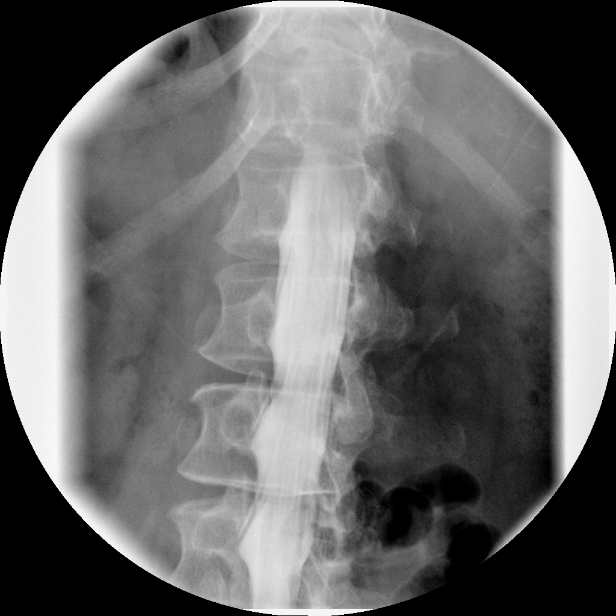

[Series 9: myelogram  white · 1 of 1 slices shown (4 of 9)]
[im 1/1]
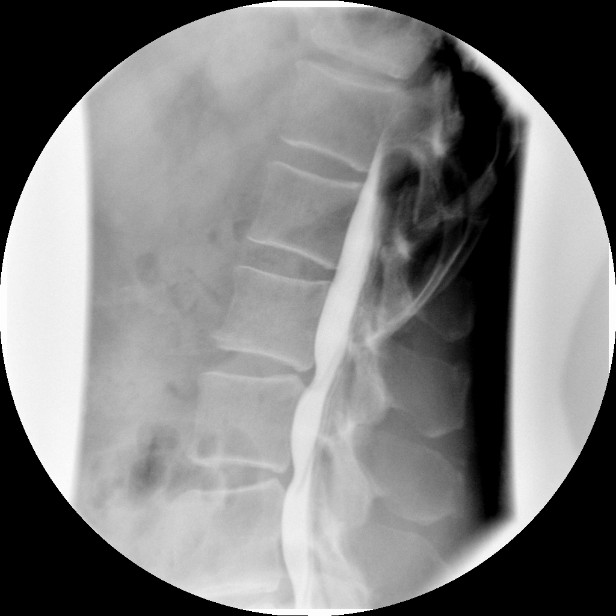

[Series 11: myelogram  white · 1 of 1 slices shown (5 of 9)]
[im 1/1]
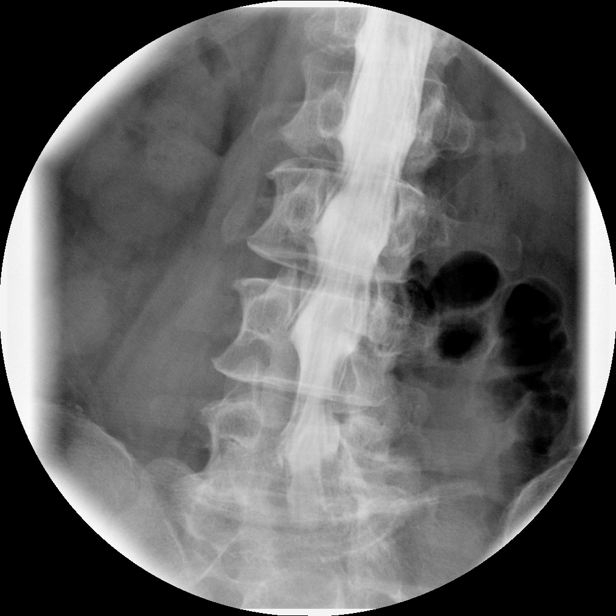

[Series 13: myelogram  white · 1 of 1 slices shown (6 of 9)]
[im 1/1]
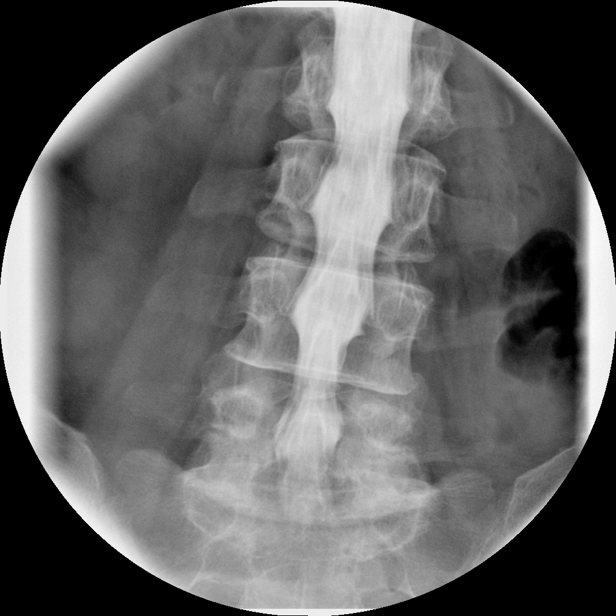

[Series 14: myelogram  white · 1 of 1 slices shown (7 of 9)]
[im 1/1]
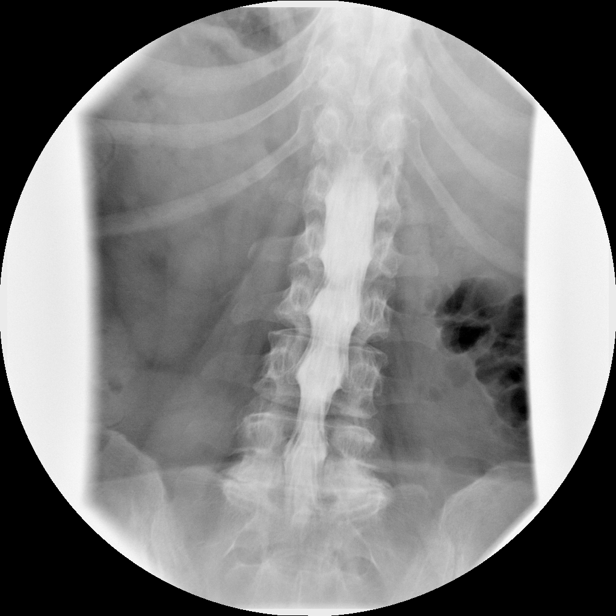

[Series 16: myelogram  white · 1 of 1 slices shown (8 of 9)]
[im 1/1]
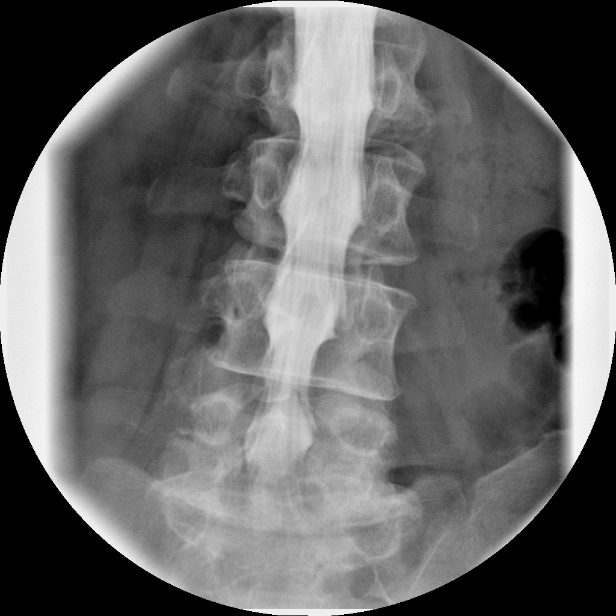

[Series 17: myelogram  white · 1 of 1 slices shown (9 of 9)]
[im 1/1]
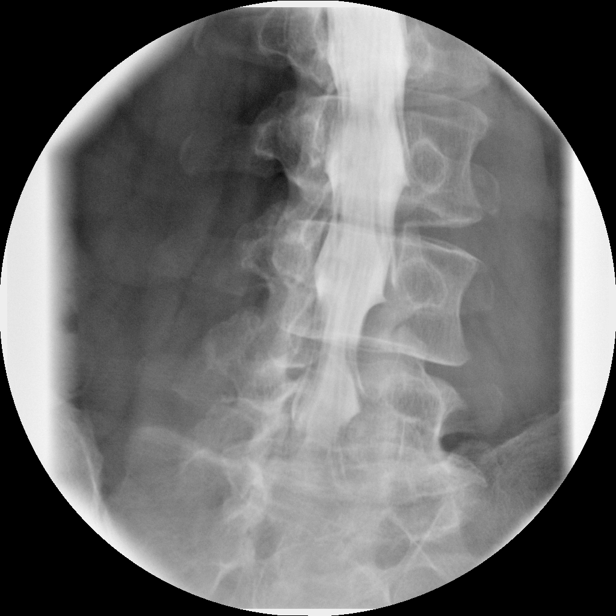

[Series 1002: view not recorded · 0.20mm/px · 1 of 1 slices shown (1 of 2)]
[im 1/1]
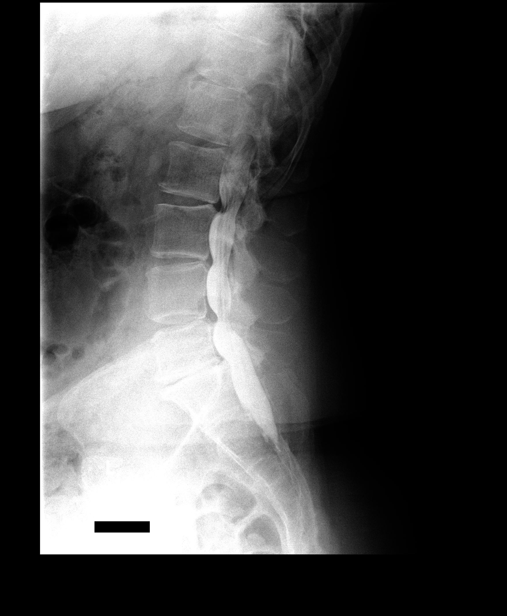

[Series 1004: view not recorded · 0.20mm/px · 1 of 1 slices shown (2 of 2)]
[im 1/1]
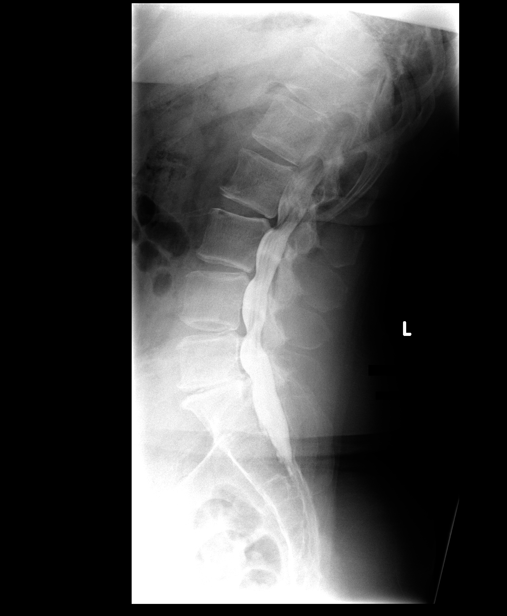

[13 of 21 positions shown; findings below may reference images not displayed]

IMPRESSION: Successful injection of  intrathecal contrast for myelography.

MYELOGRAM LUMBAR
FINDINGS: There is good opacification of the lumbar subarachnoid
space.  There is advanced disc space narrowing at L5-S1.  There is
asymmetric lateral recess encroachment at L4-5 on the right with
displacement of the right L5 nerve root and right side of the
thecal sac.  There may be slight truncation of the right L4 nerve
root (image 1 series 17, arrow).  This is also evident on the
standing upright film.

With the patient standing, the alignment remains anatomic.  There
is a large ventral defect at L4-5.  Stenosis slightly worse in
flexion.  No dynamic instability.  Smaller ventral defects L2-3 and
L3-4.

Fluoroscopy Time: 1.18 minutes
IMPRESSION: As above.

CT MYELOGRAPHY LUMBAR SPINE
FINDINGS: No prevertebral or paraspinous masses.  No worrisome
osseous lesions.  Conus minimally low ending at L1-2.  No
intradural lesions.

L1-2: Normal interspace.

L2-3: Shallow central protrusion is non compressive. Slight left
foraminal stenosis.

L3-4: Mild bulge.  Mild facet arthropathy. Slight left foraminal
stenosis.

L4-5: Slight asymmetric loss of interspace height on the right with
vacuum phenomenon.  Moderate central protrusion extends to the
right and left neural foramina.  There is asymmetric facet
arthropathy on the right with bilateral ligament flavum
hypertrophy.  Prominent epidural fat bilaterally.  Mild medial
deviation right L5 nerve root without frank cut off.  Bilateral
neural foraminal narrowing is multifactorial, related to disc
material, ligamentum flavum hypertrophy, and slight loss of
interspace height; either L4 nerve root could be affected, slightly
worse on the right.

L5-S1: Severe disc space narrowing.  Calcified central protrusion.
Bilateral facet arthropathy with symmetric ovoid outpouchings
roughly 8 mm in diameter resembling calcified or ossified synovial
cysts.  There is no definite S1 neural encroachment in the canal,
but severe bilateral L5 nerve root encroachment is present in the
foramina.

Compared with prior MR, a similar appearance is noted.
IMPRESSION: Severe disc space narrowing at L5-S1 with a calcified central
protrusion.  Multifactorial bilateral foraminal narrowing likely
compresses both L5 nerve roots.

Moderate central protrusion at L4-5 is more pronounced with the
patient upright.  There is no dynamic instability with regard to
subluxation, but right greater than left lateral recess
encroachment (and presumably foraminal narrowing) is more obvious
with the patient standing.  There does not appear be an upward
migrated free fragment although ligamentum flavum hypertrophy and
epidural fat are prominent at the L4-5 level in addition to the
central protrusion.

## 2013-04-25 IMAGING — CT CT L SPINE W/ CM
4 of 10 series · 12 of 33 positions shown, 14 images · IV contrast (omnipaque)
Comparison: MRI 10/16/2012.

MYELOGRAM INJECTION
TECHNIQUE: Informed consent was obtained from the patient prior to
the procedure, including potential complications of headache,
allergy, infection and pain. Specific instructions were given
regarding 24 hour bedrest post procedure to prevent post-LP
headache.  A timeout procedure was performed.  With the patient
prone, the lower back was prepped with Betadine.  1% Lidocaine was
used for local anesthesia.  Lumbar puncture was performed by the
radiologist at the L3-L4 level using a 22 gauge needle with return
of clear CSF. I personally performed the lumbar puncture and
administered the intrathecal contrast. I also personally supervised
acquisition of the myelogram images. 15 cc of Omnipaque 180 was
injected into the subarachnoid space .
CLINICAL DATA: Low back pain.  Right leg pain.
TECHNIQUE: Multidetector CT imaging of the lumbar spine was
performed following myelography.  Multiplanar CT image
reconstructions were also generated.

[Series 2: l spine bone · axial · 0.27mm/px · z∈[-24,+46]mm · 2 of 86 slices shown, 3 images]
[im 29/86  soft-tissue]
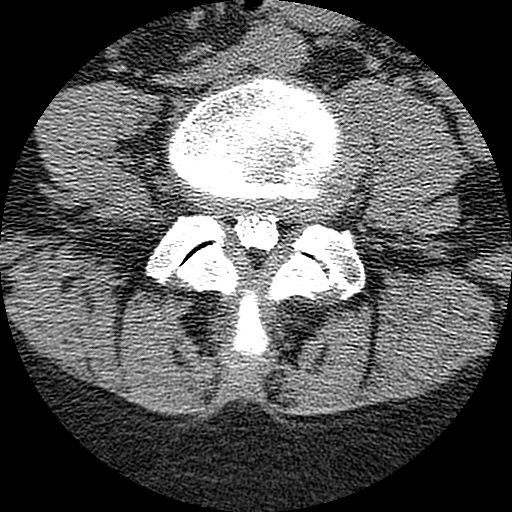
[im 29/86  bone]
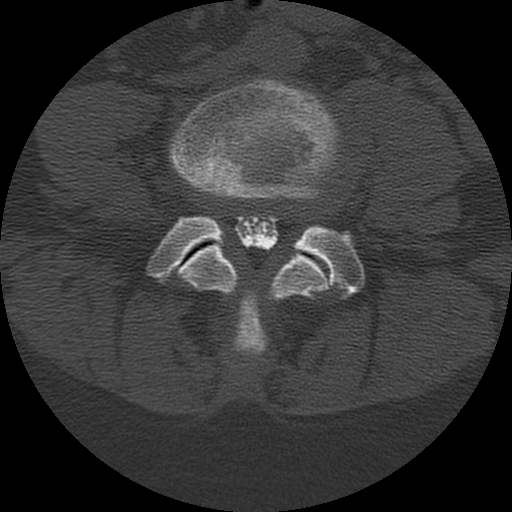
[im 57/86  bone]
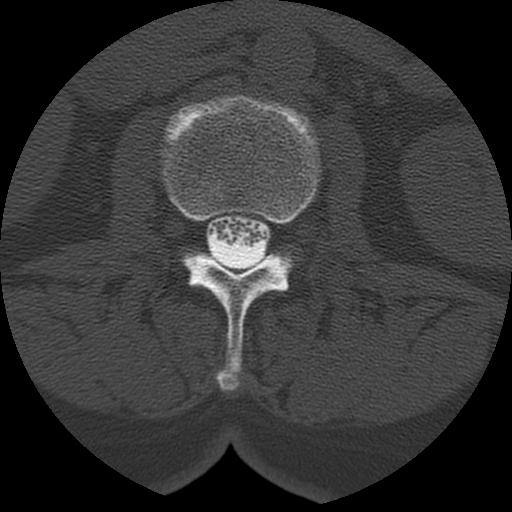

[Series 3: l spine soft · axial · 0.27mm/px · z∈[-24,+46]mm · 2 of 86 slices shown]
[im 29/86  soft-tissue]
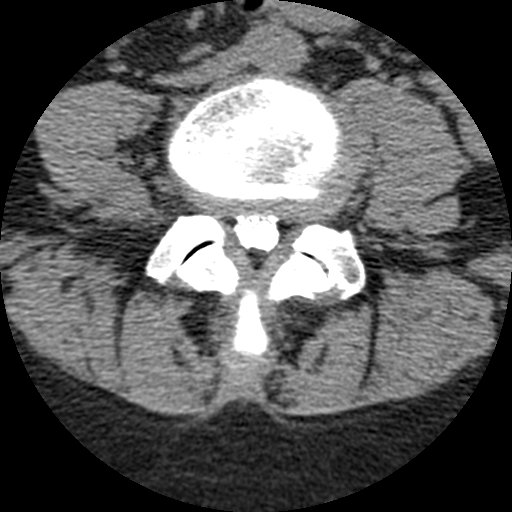
[im 57/86  soft-tissue]
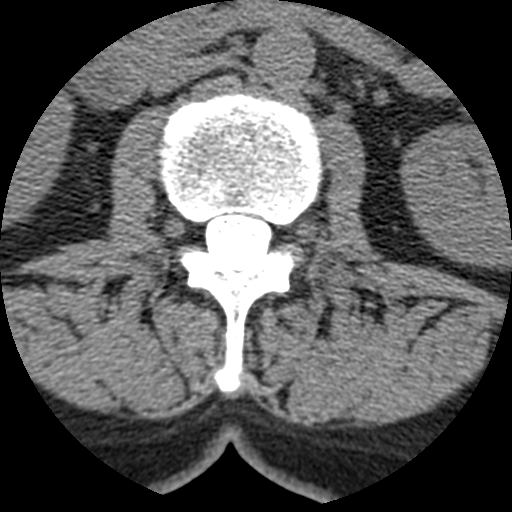

[Series 400: sag · sagittal · 0.42mm/px · 5 of 50 slices shown, 6 images]
[im 17/50  bone]
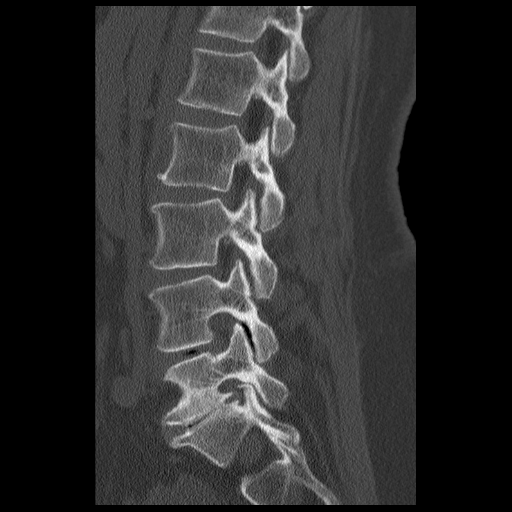
[im 21/50  bone]
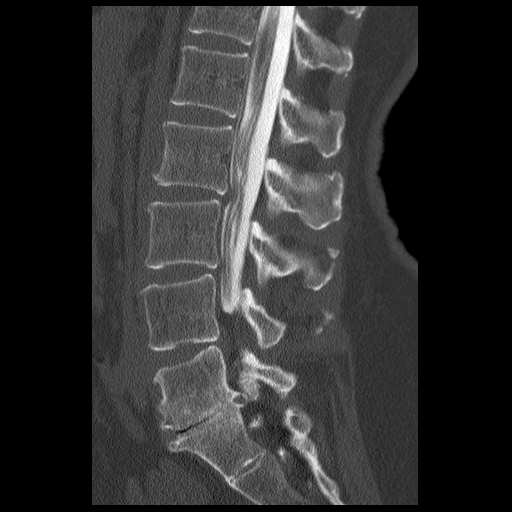
[im 25/50  soft-tissue]
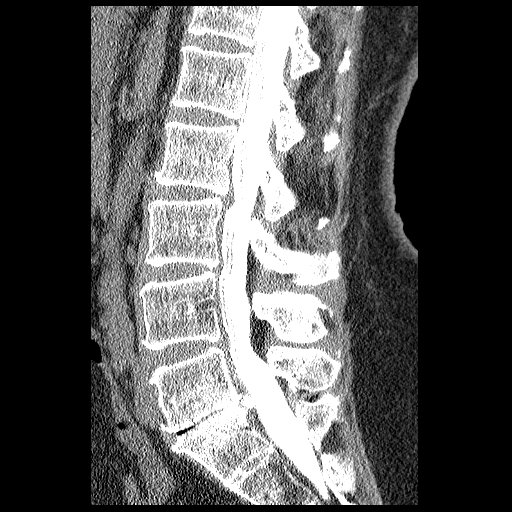
[im 25/50  bone]
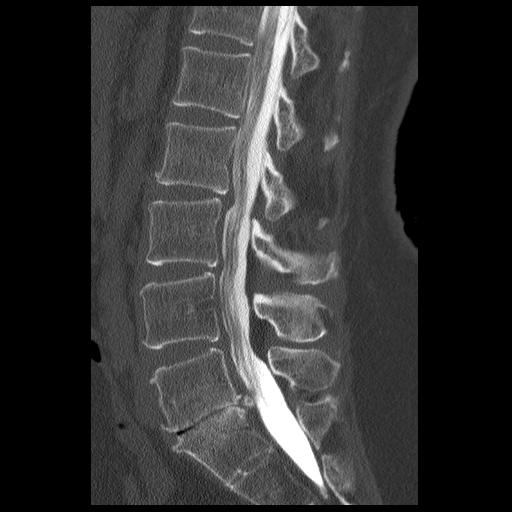
[im 29/50  bone]
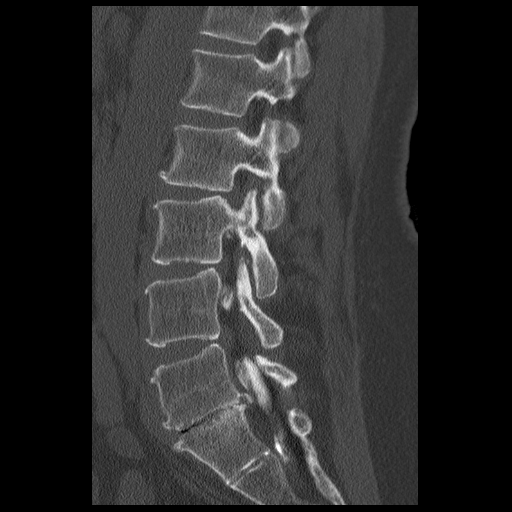
[im 33/50  bone]
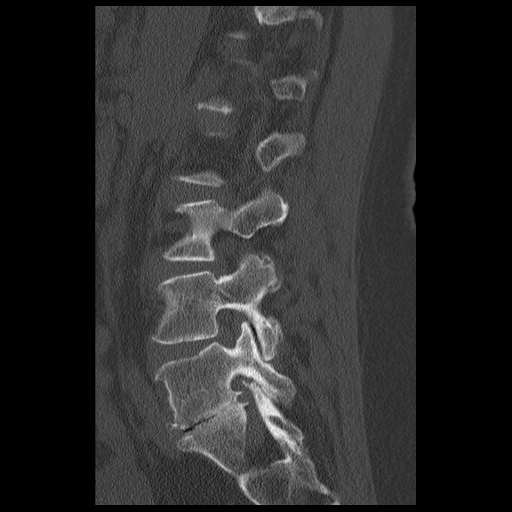

[Series 401: cor · coronal · 0.42mm/px · 3 of 57 slices shown]
[im 12/57  bone]
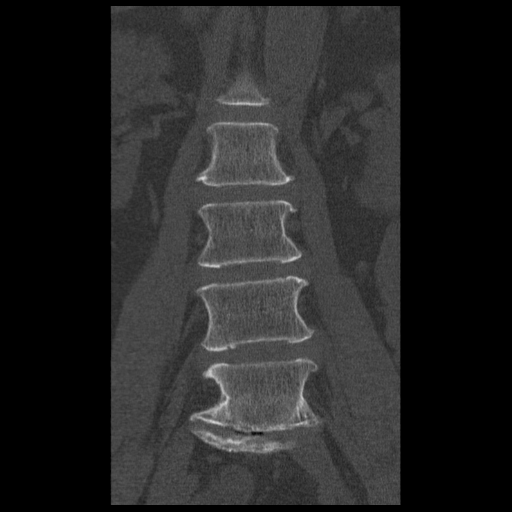
[im 23/57  bone]
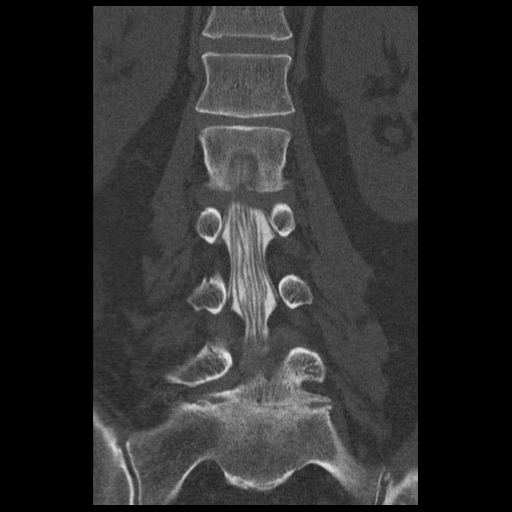
[im 34/57  bone]
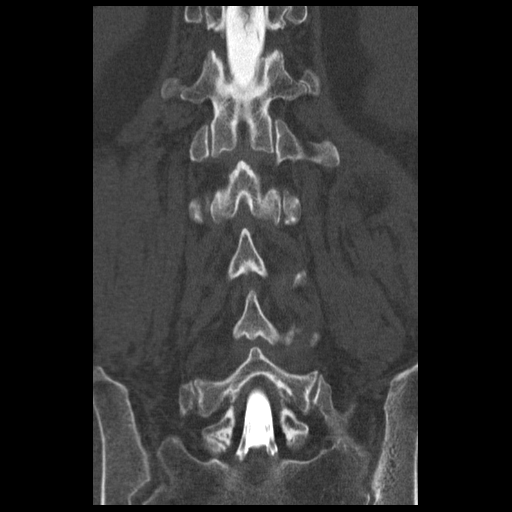

[12 of 33 positions shown; findings below may reference images not displayed]

IMPRESSION: Successful injection of  intrathecal contrast for myelography.

MYELOGRAM LUMBAR
FINDINGS: There is good opacification of the lumbar subarachnoid
space.  There is advanced disc space narrowing at L5-S1.  There is
asymmetric lateral recess encroachment at L4-5 on the right with
displacement of the right L5 nerve root and right side of the
thecal sac.  There may be slight truncation of the right L4 nerve
root (image 1 series 17, arrow).  This is also evident on the
standing upright film.

With the patient standing, the alignment remains anatomic.  There
is a large ventral defect at L4-5.  Stenosis slightly worse in
flexion.  No dynamic instability.  Smaller ventral defects L2-3 and
L3-4.

Fluoroscopy Time: 1.18 minutes
IMPRESSION: As above.

CT MYELOGRAPHY LUMBAR SPINE
FINDINGS: No prevertebral or paraspinous masses.  No worrisome
osseous lesions.  Conus minimally low ending at L1-2.  No
intradural lesions.

L1-2: Normal interspace.

L2-3: Shallow central protrusion is non compressive. Slight left
foraminal stenosis.

L3-4: Mild bulge.  Mild facet arthropathy. Slight left foraminal
stenosis.

L4-5: Slight asymmetric loss of interspace height on the right with
vacuum phenomenon.  Moderate central protrusion extends to the
right and left neural foramina.  There is asymmetric facet
arthropathy on the right with bilateral ligament flavum
hypertrophy.  Prominent epidural fat bilaterally.  Mild medial
deviation right L5 nerve root without frank cut off.  Bilateral
neural foraminal narrowing is multifactorial, related to disc
material, ligamentum flavum hypertrophy, and slight loss of
interspace height; either L4 nerve root could be affected, slightly
worse on the right.

L5-S1: Severe disc space narrowing.  Calcified central protrusion.
Bilateral facet arthropathy with symmetric ovoid outpouchings
roughly 8 mm in diameter resembling calcified or ossified synovial
cysts.  There is no definite S1 neural encroachment in the canal,
but severe bilateral L5 nerve root encroachment is present in the
foramina.

Compared with prior MR, a similar appearance is noted.
IMPRESSION: Severe disc space narrowing at L5-S1 with a calcified central
protrusion.  Multifactorial bilateral foraminal narrowing likely
compresses both L5 nerve roots.

Moderate central protrusion at L4-5 is more pronounced with the
patient upright.  There is no dynamic instability with regard to
subluxation, but right greater than left lateral recess
encroachment (and presumably foraminal narrowing) is more obvious
with the patient standing.  There does not appear be an upward
migrated free fragment although ligamentum flavum hypertrophy and
epidural fat are prominent at the L4-5 level in addition to the
central protrusion.

## 2013-05-29 ENCOUNTER — Encounter: Payer: Self-pay | Admitting: Family Medicine

## 2013-05-29 ENCOUNTER — Ambulatory Visit (INDEPENDENT_AMBULATORY_CARE_PROVIDER_SITE_OTHER): Payer: 59 | Admitting: Family Medicine

## 2013-05-29 VITALS — BP 110/78 | HR 82 | Temp 97.7°F | Wt 168.0 lb

## 2013-05-29 DIAGNOSIS — G4700 Insomnia, unspecified: Secondary | ICD-10-CM

## 2013-05-29 DIAGNOSIS — Z23 Encounter for immunization: Secondary | ICD-10-CM

## 2013-05-29 DIAGNOSIS — F331 Major depressive disorder, recurrent, moderate: Secondary | ICD-10-CM

## 2013-05-29 MED ORDER — RAMELTEON 8 MG PO TABS: 8.0000 mg | ORAL_TABLET | Freq: Every day | ORAL | Status: DC

## 2013-05-29 NOTE — Progress Notes (Signed)
  Subjective:    Patient ID: Michelle Jensen, female    DOB: 03/30/1956, 57 y.o.   MRN: [id number]  HPI Followup regarding depression Refer to prior note. She remains on higher dose sertraline and we added low-dose Abilify. She never started this because of cost issues. However, she is doing much improved at this time. She's had some changes with regard her job and possible early buyout. She is still having some insomnia issues. Depressed mood is improved. No suicidal ideation. No consistent exercise.  She's taken Ambien in the past. She takes melatonin but has some hangover drowsiness next morning.  Past Medical History  Diagnosis Date  . Depression   . IBS (irritable bowel syndrome)   . Anxiety   . Lymphocytic colitis 07/2011  . Diverticulosis    Past Surgical History  Procedure Laterality Date  . Arthroscopic repair acl      rt knee  . Ganglion cyst excision      rt wrist    reports that she quit smoking about 8 years ago. Her smoking use included Cigarettes. She has a 3 pack-year smoking history. She has never used smokeless tobacco. She reports that she drinks alcohol. She reports that she does not use illicit drugs. family history includes Colon cancer in her mother; Diabetes in her paternal grandmother; Drug abuse in her sister; Rectal cancer in her mother. There is no history of Esophageal cancer or Stomach cancer. No Known Allergies      Review of Systems  Constitutional: Negative for appetite change and unexpected weight change.  Respiratory: Negative for shortness of breath.   Cardiovascular: Negative for chest pain.  Psychiatric/Behavioral: Positive for sleep disturbance. Negative for suicidal ideas and agitation. The patient is not nervous/anxious.        Objective:   Physical Exam  Constitutional: She appears well-developed and well-nourished.  Cardiovascular: Normal rate and regular rhythm.   Pulmonary/Chest: Effort normal and breath sounds normal. No  respiratory distress. She has no wheezes. She has no rales.  Psychiatric: She has a normal mood and affect. Her behavior is normal. Thought content normal.          Assessment & Plan:  History of recurrent depression currently stable. We discussed trying to get established with some regular exercise. She has some chronic insomnia. We've recommended trial of Rozerem 8 mg each bedtime.

## 2013-05-29 NOTE — Patient Instructions (Signed)

## 2013-05-29 NOTE — Addendum Note (Signed)
Addended by: Shelby Dubin E on: 05/29/2013 01:47 PM   Modules accepted: Orders

## 2013-07-08 ENCOUNTER — Telehealth: Payer: Self-pay | Admitting: Family Medicine

## 2013-07-08 NOTE — Telephone Encounter (Signed)
Pt states she needs a letter written to her employer stating she has been under the care of Dr. Caryl Never since Sept 2014 - current for depression and anxiety.  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

## 2013-07-08 NOTE — Telephone Encounter (Signed)
Please clarify.  She has been under my care since 2011.  Do we need to cover specific dates?

## 2013-07-09 NOTE — Telephone Encounter (Signed)
Pt needs note to state" Pt has been under dr's continually care for her ongoing problems,  from beginning date 03/24/13 to 06/23/13 " Pt is going to her home office on thurs morning, 12/18. Pt needs note faxed to them prior to that.  Please call w/ anymore qestions

## 2013-07-09 NOTE — Telephone Encounter (Signed)
Left message for patient to return call.

## 2013-07-10 NOTE — Telephone Encounter (Signed)
OK to produce brief letter to that effect.

## 2013-07-10 NOTE — Telephone Encounter (Signed)
Left message on VM letter is ready for pick up

## 2013-07-15 ENCOUNTER — Ambulatory Visit: Payer: 59 | Admitting: Family Medicine

## 2013-07-15 ENCOUNTER — Telehealth: Payer: Self-pay | Admitting: Family Medicine

## 2013-07-15 NOTE — Telephone Encounter (Signed)
Is fine to reschedule pt

## 2013-07-15 NOTE — Telephone Encounter (Signed)
Pt missed her appt today. 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 Pt would like to resch to tue or wed. Can I use SDA slot?

## 2013-07-17 ENCOUNTER — Ambulatory Visit (INDEPENDENT_AMBULATORY_CARE_PROVIDER_SITE_OTHER): Payer: 59 | Admitting: Family Medicine

## 2013-07-17 ENCOUNTER — Encounter: Payer: Self-pay | Admitting: Family Medicine

## 2013-07-17 ENCOUNTER — Ambulatory Visit: Payer: 59 | Admitting: Family Medicine

## 2013-07-17 VITALS — BP 128/80 | HR 60 | Temp 98.0°F | Wt 169.0 lb

## 2013-07-17 DIAGNOSIS — F3289 Other specified depressive episodes: Secondary | ICD-10-CM

## 2013-07-17 DIAGNOSIS — F329 Major depressive disorder, single episode, unspecified: Secondary | ICD-10-CM

## 2013-07-17 MED ORDER — SERTRALINE HCL 100 MG PO TABS: ORAL_TABLET | ORAL | Status: DC

## 2013-07-17 NOTE — Progress Notes (Signed)
   Subjective:    Patient ID: Michelle Jensen, female    DOB: 1956/02/14, 57 y.o.   MRN: [id number]  HPI Patient seen for followup regarding depression She recently increased her sertraline herself up to 200 mg daily. She is seeing improvement. No side effects. Continues to have some insomnia issues. She did not have any insurance coverage for Rozerem and so she never started this. She is reluctant to start any sleep medications. Takes occasional Benadryl. No suicidal ideation. She is back to work full time. She is looking at retirement within the next year and thinks this will help her stress level tremendously.  Past Medical History  Diagnosis Date  . Depression   . IBS (irritable bowel syndrome)   . Anxiety   . Lymphocytic colitis 07/2011  . Diverticulosis    Past Surgical History  Procedure Laterality Date  . Arthroscopic repair acl      rt knee  . Ganglion cyst excision      rt wrist    reports that she quit smoking about 8 years ago. Her smoking use included Cigarettes. She has a 3 pack-year smoking history. She has never used smokeless tobacco. She reports that she drinks alcohol. She reports that she does not use illicit drugs. family history includes Colon cancer in her mother; Diabetes in her paternal grandmother; Drug abuse in her sister; Rectal cancer in her mother. There is no history of Esophageal cancer or Stomach cancer. No Known Allergies    Review of Systems  Constitutional: Negative for appetite change and unexpected weight change.  Psychiatric/Behavioral: Positive for sleep disturbance and dysphoric mood. Negative for suicidal ideas and agitation. The patient is not nervous/anxious.        Objective:   Physical Exam  Constitutional: She appears well-developed and well-nourished.  Cardiovascular: Normal rate.   Pulmonary/Chest: Effort normal and breath sounds normal. No respiratory distress. She has no wheezes. She has no rales.  Psychiatric: She has a normal  mood and affect. Her behavior is normal.          Assessment & Plan:  Recurrent depression currently stable and improved on high-dose sertraline. Continue sertraline 200 mg daily with refills given. Sleep hygiene discussed. Routine followup 6 months.

## 2013-08-15 ENCOUNTER — Telehealth: Payer: Self-pay | Admitting: Family Medicine

## 2013-08-15 MED ORDER — ZOLPIDEM TARTRATE 10 MG PO TABS: 10.0000 mg | ORAL_TABLET | Freq: Every evening | ORAL | Status: DC | PRN

## 2013-08-15 NOTE — Telephone Encounter (Signed)
ambien 10 mg po qhs prn insomnia #20 with no refill.

## 2013-08-15 NOTE — Telephone Encounter (Signed)
RN attempted to contact patient, left a voice mail.   ° °

## 2013-08-15 NOTE — Telephone Encounter (Signed)
RX called to pharmacy. Pt is informed

## 2013-08-15 NOTE — Telephone Encounter (Signed)
Last visit 07/17/13

## 2013-08-15 NOTE — Telephone Encounter (Signed)
Patient Information:  Caller Name: Karynn  Phone: [phone]  Patient: Michelle Jensen, Michelle Jensen  Gender: Female  DOB: October 08, 1955  Age: 58 Years  PCP: [facility])  Office Follow Up:  Does the office need to follow up with this patient?: Yes  Instructions For The Office: requesting prescription  RN Note:  [facility]- [facility] [phone].  Please contact regarding Rx. Will accept appt if needed.  Symptoms  Reason For Call & Symptoms: Patient states has been having issues with not sleeping due to her history of anxiety. Her retirement with [employer] has depression anxiety started.  She did not sleep at all last night.  She states her "brain turns on at night ".  She is asking for something to help her sleep . (i.e ambien, xanax). She is feeling overwhelmed with no sleep. She states she has discussed issues with Dr. Caryl Never in the past.  She is currently on zoloft 100mg  po daily  Reviewed Health History In EMR: Yes  Reviewed Medications In EMR: Yes  Reviewed Allergies In EMR: Yes  Reviewed Surgeries / Procedures: Yes  Date of Onset of Symptoms: 08/15/2013  Treatments Tried: Music, relaxation techniques, melatonin, Benadryl  Treatments Tried Worked: No  Guideline(s) Used:  Insomnia  Depression  Disposition Per Guideline:   See Within 3 Days in Office  Reason For Disposition Reached:   Insomnia interferes with work or school  Advice Given:  Tips For Good Sleep:  Use your bed only for sleeping and sex. Do not use your bed for eating, reading or watching TV.  Regular exercise is good for your body and helps you sleep. Do not exercise right before bedtime.  Drink a small glass of warm milk at bedtime.  Take a warm bath or shower before bedtime.  Try to go to bed at the same time each night. More importantly, get up at the same time each morning (don't sleep in).  Tips For Good Sleep - Your Bedroom:  Keep bedroom temperature cool, not warm or cold.  Keep bedroom quiet and dark.  Use a comfortable mattress.  Tips For Good Sleep - When You Can  If you can't fall asleep after 30 minutes, get out of bed and do something relaxing.  Read a book or listen to some soothing music.  When you feel sleepy, go back to bed.  Repeat these steps as needed.  Tips For Good Sleep - When Worrying Keeps You Awake:  Do not use your bed as a place to worry about your problems.  Get up and write down problems or things that you need to do. Reassure yourself that you can look at this list in the morning.  Tips For Good Sleep - What To Avoid:  Avoid caffeine within 6 hours of bedtime.  Avoid alcohol within 4 hours of bedtime.  Avoid nicotine within 2 hours of bedtime (e.g., cigarettes).  Avoid heavy meals just before bedtime. Eating a light snack is OK.  Do not drink over 1 glass of liquid at bedtime (Reason: will need to get up to urinate).  Call Back If:  Insomnia symptoms persist over 2 weeks  You become worse.  Jet Lag:  Many people find that crossing time zones disturbs their natural biorhythms. This is called Jet Lag.  Symptoms of Jet Lag include: insomnia, difficulty concentrating, upset stomach, and headaches.  Jet Lag is usually worse when traveling eastward than when traveling westward.  RN Overrode Recommendation:  Patient Requests Prescription  Request  prescription

## 2013-09-18 ENCOUNTER — Ambulatory Visit (INDEPENDENT_AMBULATORY_CARE_PROVIDER_SITE_OTHER): Payer: Managed Care, Other (non HMO) | Admitting: Family Medicine

## 2013-09-18 ENCOUNTER — Encounter: Payer: Self-pay | Admitting: Family Medicine

## 2013-09-18 VITALS — BP 120/80 | HR 77 | Temp 97.5°F | Wt 170.0 lb

## 2013-09-18 DIAGNOSIS — F3289 Other specified depressive episodes: Secondary | ICD-10-CM

## 2013-09-18 DIAGNOSIS — F329 Major depressive disorder, single episode, unspecified: Secondary | ICD-10-CM

## 2013-09-18 DIAGNOSIS — B009 Herpesviral infection, unspecified: Secondary | ICD-10-CM

## 2013-09-18 MED ORDER — VALACYCLOVIR HCL 500 MG PO TABS: 500.0000 mg | ORAL_TABLET | Freq: Every day | ORAL | Status: DC

## 2013-09-18 MED ORDER — VALACYCLOVIR HCL 1 G PO TABS: ORAL_TABLET | ORAL | Status: DC

## 2013-09-18 NOTE — Progress Notes (Signed)
   Subjective:    Patient ID: Michelle Jensen, female    DOB: 1956-01-19, 58 y.o.   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 She's had very frequent flareups over the past year. Requesting refills of Valtrex and she would also like to consider possible prophylaxis as she's had several episodes this year. She attributes a lot of this to stress issues. She has occasional neuropathic type pains radiating down right lower extremity. Pain is very intense at times. Pain seems to improve as her recurrent herpes simplex lesions heal.  She has not worked since December. She has history of recurrent depression and has continued to battle this. She's had counseling provided through work. She remains on sertraline 200 mg daily. She had previously been intolerant of Wellbutrin. Denies any suicidal ideation.  She is also dealing with some stress issues with living with her brother.  She does not have the money to move out at this point. They have had several financial stressors with keeping up a dilapidated house.  Past Medical History  Diagnosis Date  . Depression   . IBS (irritable bowel syndrome)   . Anxiety   . Lymphocytic colitis 07/2011  . Diverticulosis    Past Surgical History  Procedure Laterality Date  . Arthroscopic repair acl      rt knee  . Ganglion cyst excision      rt wrist    reports that she quit smoking about 8 years ago. Her smoking use included Cigarettes. She has a 3 pack-year smoking history. She has never used smokeless tobacco. She reports that she drinks alcohol. She reports that she does not use illicit drugs. family history includes Colon cancer in her mother; Diabetes in her paternal grandmother; Drug abuse in her sister; Rectal cancer in her mother. There is no history of Esophageal cancer or Stomach cancer. No Known Allergies    Review of Systems  Constitutional: Negative for appetite change  and unexpected weight change.  Respiratory: Negative for cough and shortness of breath.   Cardiovascular: Negative for chest pain.  Skin: Positive for rash.  Psychiatric/Behavioral: Positive for dysphoric mood. Negative for suicidal ideas, hallucinations, confusion, sleep disturbance and agitation.       Objective:   Physical Exam  Constitutional: She is oriented to person, place, and time. She appears well-developed and well-nourished.  Cardiovascular: Normal rate.   Pulmonary/Chest: Effort normal and breath sounds normal. No respiratory distress. She has no wheezes. She has no rales.  Neurological: She is alert and oriented to person, place, and time.  Psychiatric: Judgment and thought content normal.  Patient appears slightly anxious but very appropriate in responses. Slightly tearful off and on during interview.          Assessment & Plan:  #1 history of recurrent herpes simplex involving right buttock. We discussed possible prophylactic treatment with Valtrex 500 mg once daily with prescription written #2 history of recurrent/resistent depression. We have strongly advocated regular counseling and she'll try to set this up to her work program. Melina FiddlerWe've tried many other antidepressants in combination past without much success. She's been reluctant to see psychiatrist recently. We discussed other issues with treating depression including more consistent exercise and developing and maintaining some new hobbies. Paperwork completed for work. She's been out since December. We do not feel she would be able to focus at this point.

## 2013-09-18 NOTE — Progress Notes (Signed)
Pre visit review using our clinic review tool, if applicable. No additional management support is needed unless otherwise documented below in the visit note. 

## 2013-09-20 ENCOUNTER — Other Ambulatory Visit: Payer: Self-pay

## 2013-09-20 DIAGNOSIS — Z1231 Encounter for screening mammogram for malignant neoplasm of breast: Secondary | ICD-10-CM

## 2013-09-25 ENCOUNTER — Telehealth: Payer: Self-pay | Admitting: Family Medicine

## 2013-09-25 NOTE — Telephone Encounter (Signed)
Pt needs papers this Friday if possible. She needs a release to return to work with no restrictions.

## 2013-09-25 NOTE — Telephone Encounter (Signed)
Pt left papers last week for dr burchette to fill out and her employer needs them as soon as she can get. Pt states she left a message for you tues. pls advise

## 2013-09-26 NOTE — Telephone Encounter (Signed)
Pt would like to PU paperwork, , FMLA paperwork. She will fax herself. OK to pu this afternoon? Pt will be here then.

## 2013-09-30 ENCOUNTER — Encounter: Payer: Self-pay | Admitting: Family Medicine

## 2013-10-04 ENCOUNTER — Ambulatory Visit: Payer: Managed Care, Other (non HMO)

## 2013-10-16 ENCOUNTER — Ambulatory Visit: Payer: Self-pay

## 2013-10-23 ENCOUNTER — Ambulatory Visit (HOSPITAL_COMMUNITY): Payer: Managed Care, Other (non HMO) | Admitting: Psychiatry

## 2013-11-26 ENCOUNTER — Encounter (INDEPENDENT_AMBULATORY_CARE_PROVIDER_SITE_OTHER): Payer: Self-pay

## 2013-11-26 ENCOUNTER — Ambulatory Visit
Admission: RE | Admit: 2013-11-26 | Discharge: 2013-11-26 | Disposition: A | Discharge status: Home / Self Care | Payer: Private Health Insurance - Indemnity | Source: Ambulatory Visit

## 2013-11-26 DIAGNOSIS — Z1231 Encounter for screening mammogram for malignant neoplasm of breast: Secondary | ICD-10-CM

## 2013-11-27 ENCOUNTER — Other Ambulatory Visit: Payer: Self-pay | Admitting: Family Medicine

## 2013-11-27 DIAGNOSIS — R928 Other abnormal and inconclusive findings on diagnostic imaging of breast: Secondary | ICD-10-CM

## 2013-12-05 ENCOUNTER — Other Ambulatory Visit: Payer: Self-pay

## 2013-12-05 ENCOUNTER — Other Ambulatory Visit: Payer: Self-pay | Admitting: Family Medicine

## 2013-12-05 DIAGNOSIS — R928 Other abnormal and inconclusive findings on diagnostic imaging of breast: Secondary | ICD-10-CM

## 2013-12-11 ENCOUNTER — Other Ambulatory Visit: Payer: Self-pay

## 2013-12-31 ENCOUNTER — Other Ambulatory Visit: Payer: Self-pay | Admitting: Family Medicine

## 2014-01-06 ENCOUNTER — Other Ambulatory Visit: Payer: Self-pay

## 2014-01-14 ENCOUNTER — Ambulatory Visit
Admission: RE | Admit: 2014-01-14 | Discharge: 2014-01-14 | Disposition: A | Discharge status: Home / Self Care | Payer: 59 | Source: Ambulatory Visit | Attending: Family Medicine | Admitting: Family Medicine

## 2014-01-14 DIAGNOSIS — R928 Other abnormal and inconclusive findings on diagnostic imaging of breast: Secondary | ICD-10-CM

## 2014-01-23 ENCOUNTER — Ambulatory Visit (INDEPENDENT_AMBULATORY_CARE_PROVIDER_SITE_OTHER): Payer: 59 | Admitting: Family Medicine

## 2014-01-23 ENCOUNTER — Encounter: Payer: Self-pay | Admitting: Family Medicine

## 2014-01-23 VITALS — BP 124/78 | HR 76 | Wt 167.0 lb

## 2014-01-23 DIAGNOSIS — F331 Major depressive disorder, recurrent, moderate: Secondary | ICD-10-CM

## 2014-01-23 NOTE — Progress Notes (Signed)
Pre visit review using our clinic review tool, if applicable. No additional management support is needed unless otherwise documented below in the visit note. 

## 2014-01-23 NOTE — Progress Notes (Signed)
   Subjective:    Patient ID: Michelle Jensen, female    DOB: [birth year], 58 y.o.   MRN: [id number]  HPI Followup major depression, recurrent. Patient maintained on high-dose sertraline. Recently stable. She still has flareups at time of depressed mood. Denies any recent suicidal ideation. She is anxious as off and on mostly in relation to work. Appetite and weight are stable. Sleep is fair. Compliant with medication. Denies adverse side effects. Still has intermittent exacerbations with depression that make work difficult interacting with others.  Past Medical History  Diagnosis Date  . Depression   . IBS (irritable bowel syndrome)   . Anxiety   . Lymphocytic colitis 07/2011  . Diverticulosis    Past Surgical History  Procedure Laterality Date  . Arthroscopic repair acl      rt knee  . Ganglion cyst excision      rt wrist    reports that she quit smoking about 9 years ago. Her smoking use included Cigarettes. She has a 3 pack-year smoking history. She has never used smokeless tobacco. She reports that she drinks alcohol. She reports that she does not use illicit drugs. family history includes Colon cancer in her mother; Diabetes in her paternal grandmother; Drug abuse in her sister; Rectal cancer in her mother. There is no history of Esophageal cancer or Stomach cancer. No Known Allergies    Review of Systems  Constitutional: Negative for appetite change and unexpected weight change.  Respiratory: Negative for shortness of breath.   Cardiovascular: Negative for chest pain.  Gastrointestinal: Negative for abdominal pain.  Psychiatric/Behavioral: Negative for suicidal ideas.       Objective:   Physical Exam  Constitutional: She appears well-developed and well-nourished.  Cardiovascular: Normal rate and regular rhythm.   Pulmonary/Chest: Effort normal and breath sounds normal. No respiratory distress. She has no wheezes. She has no rales.  Psychiatric: She has a normal mood  and affect. Her behavior is normal. Judgment and thought content normal.          Assessment & Plan:  Maj. depression, recurrent. Continue sertraline. Overall stable. Complete FMLA paperwork

## 2014-01-28 ENCOUNTER — Telehealth: Payer: Self-pay

## 2014-01-28 NOTE — Telephone Encounter (Signed)
Called pt and left message that paperwork was ready, but then Dr. Caryl NeverBurchette wants patient to bring in a blank FMLA form so that Dr. Caryl NeverBurchette can re write the form. Was not able to left message on patient Vm. Pt needs to bring new blank form so that Dr. Caryl NeverBurchette can complete form.

## 2014-02-26 ENCOUNTER — Telehealth: Payer: Self-pay | Admitting: Family Medicine

## 2014-02-26 NOTE — Telephone Encounter (Signed)
Pt states very important you call her asap. Her work is giving her problems and she needs to speak w/ you!!

## 2014-02-27 NOTE — Telephone Encounter (Signed)
Left message on pt Vm that forms were faxed.

## 2014-02-27 NOTE — Telephone Encounter (Signed)
Left message for patient to return call.

## 2014-10-09 ENCOUNTER — Other Ambulatory Visit: Payer: Self-pay | Admitting: Family Medicine

## 2014-11-04 ENCOUNTER — Other Ambulatory Visit: Payer: Self-pay | Admitting: Family Medicine

## 2015-03-03 ENCOUNTER — Other Ambulatory Visit: Payer: Self-pay | Admitting: Family Medicine

## 2015-04-03 ENCOUNTER — Other Ambulatory Visit: Payer: Self-pay | Admitting: Family Medicine

## 2016-06-15 ENCOUNTER — Encounter: Payer: Self-pay | Admitting: Gastroenterology

## 2019-05-13 ENCOUNTER — Ambulatory Visit (INDEPENDENT_AMBULATORY_CARE_PROVIDER_SITE_OTHER): Payer: BLUE CROSS/BLUE SHIELD | Admitting: Physician Assistant

## 2019-05-13 ENCOUNTER — Encounter: Payer: Self-pay | Admitting: Physician Assistant

## 2019-05-13 VITALS — Ht 65.0 in | Wt 160.0 lb

## 2019-05-13 DIAGNOSIS — F331 Major depressive disorder, recurrent, moderate: Secondary | ICD-10-CM | POA: Diagnosis not present

## 2019-05-13 MED ORDER — SERTRALINE HCL 50 MG PO TABS: 50.0000 mg | ORAL_TABLET | Freq: Every day | ORAL | 3 refills | Status: AC

## 2019-05-13 NOTE — Progress Notes (Signed)
I acted as a Neurosurgeon for [employer], PA-C Corky Mull, LPN  TELEPHONE ENCOUNTER   Patient verbally agreed to telephone visit and is aware that copayment and coinsurance may apply. Patient was treated using telemedicine according to accepted telemedicine protocols.  Location of the patient: home Location of provider: [facility] Names of all persons participating in the telemedicine service and role in the encounter: Jarold Motto, [place], Corky Mull, LPN  Subjective:   Chief Complaint  Patient presents with  . Establish Care     She has been lost to follow-up with prior PCP, Dr. Caryl Never. Last seen by him in 2015.    HPI   Depression/anxiety -- has been on zoloft for about 20 years, she has been on variable doses. About 15 years ago was briefly on Wellbutrin but did not tolerate this. She has most recently been on 50 mg daily. She tolerates this well and is in need for a refill. She would like to continue this dosage. Denies SI/HI.     Office Visit from 05/13/2019 in [facility]  PHQ-9 Total Score  2     GAD 7 : Generalized Anxiety Score 05/13/2019  Nervous, Anxious, on Edge 1  Control/stop worrying 1  Worry too much - different things 1  Trouble relaxing 0  Restless 0  Easily annoyed or irritable 1  Afraid - awful might happen 0  Total GAD 7 Score 4  Anxiety Difficulty Not difficult at all      Patient Active Problem List   Diagnosis Date Noted  . Major depressive disorder, recurrent episode, moderate (HCC) 04/05/2013  . ECZEMA 07/12/2010  . ALLERGIC RHINITIS 05/06/2010  . INSOMNIA, TRANSIENT 05/06/2010  . PERIMENOPAUSAL SYNDROME 12/01/2009  . HSV 06/17/2009  . DEPRESSION 06/17/2009   Social History   Tobacco Use  . Smoking status: Former Smoker    Packs/day: 0.30    Years: 10.00    Pack years: 3.00    Types: Cigarettes    Quit date: 10/31/2004    Years since quitting: 14.5  . Smokeless tobacco:  Never Used  Substance Use Topics  . Alcohol use: Yes    Alcohol/week: 2.0 standard drinks    Types: 2 Glasses of wine per week    Current Outpatient Medications:  .  Calcium Carbonate Antacid (ALKA-SELTZER ANTACID PO), Take 1-2 each by mouth as needed., Disp: , Rfl:  .  ibuprofen (ADVIL) 200 MG tablet, Take 200 mg by mouth as needed., Disp: , Rfl:  .  LYSINE PO, Take 1 tablet by mouth as needed. , Disp: , Rfl:  .  Misc Natural Products (OSTEO BI-FLEX JOINT SHIELD PO), Take 1 tablet by mouth daily., Disp: , Rfl:  .  Nutritional Supplements (MELATONIN PO), Take 1 tablet by mouth as needed., Disp: , Rfl:  .  Probiotic Product (PROBIOTIC DAILY PO), Take 1 tablet by mouth daily., Disp: , Rfl:  .  Turmeric (QC TUMERIC COMPLEX) 500 MG CAPS, Take 1 capsule by mouth daily., Disp: , Rfl:  .  sertraline (ZOLOFT) 50 MG tablet, Take 1 tablet (50 mg total) by mouth at bedtime., Disp: 90 tablet, Rfl: 3 No Known Allergies  Assessment & Plan:   1. Major depressive disorder, recurrent episode, moderate (HCC)    Well-controlled. Refill Zoloft 50 mg. Did discuss the need to get her in for CPE soon so we can update labs, has been >5 years. Patient verbalized understanding to plan. I discussed with patient that if  they develop any SI, to tell someone immediately and seek medical attention.   No orders of the defined types were placed in this encounter.  Meds ordered this encounter  Medications  . sertraline (ZOLOFT) 50 MG tablet    Sig: Take 1 tablet (50 mg total) by mouth at bedtime.    Dispense:  90 tablet    Refill:  3    Order Specific Question:   Supervising Provider    Answer:   Juleen China, ERICA [place], PA 05/13/2019  Time spent with the patient: 9 minutes, spent in obtaining information about her symptoms, reviewing her previous labs, evaluations, and treatments, counseling her about her condition (please see the discussed topics above), and developing a plan to further  investigate it; she had a number of questions which I addressed.   [id number] physician/qualified health professional telephone evaluation 5 to 10 minutes [id number] physician/qualified help functional [place] evaluation for 11 to 20 minutes [id number] physician/qualify he will professional telephone evaluation for 21 to 30 minutes

## 2020-11-26 ENCOUNTER — Other Ambulatory Visit: Payer: Self-pay | Admitting: Physician Assistant

## 2022-11-24 DIAGNOSIS — Z0000 Encounter for general adult medical examination without abnormal findings: Secondary | ICD-10-CM | POA: Diagnosis not present

## 2022-11-24 DIAGNOSIS — E559 Vitamin D deficiency, unspecified: Secondary | ICD-10-CM | POA: Diagnosis not present

## 2022-11-24 DIAGNOSIS — Z136 Encounter for screening for cardiovascular disorders: Secondary | ICD-10-CM | POA: Diagnosis not present

## 2022-11-28 ENCOUNTER — Other Ambulatory Visit: Payer: Self-pay | Admitting: Family Medicine

## 2022-11-28 DIAGNOSIS — N6311 Unspecified lump in the right breast, upper outer quadrant: Secondary | ICD-10-CM

## 2022-11-28 DIAGNOSIS — E2839 Other primary ovarian failure: Secondary | ICD-10-CM

## 2022-11-29 ENCOUNTER — Other Ambulatory Visit: Payer: Self-pay | Admitting: Family Medicine

## 2022-11-29 DIAGNOSIS — N6311 Unspecified lump in the right breast, upper outer quadrant: Secondary | ICD-10-CM

## 2022-11-29 DIAGNOSIS — N644 Mastodynia: Secondary | ICD-10-CM

## 2022-12-14 ENCOUNTER — Ambulatory Visit
Admission: RE | Admit: 2022-12-14 | Discharge: 2022-12-14 | Disposition: A | Discharge status: Home / Self Care | Payer: BLUE CROSS/BLUE SHIELD | Source: Ambulatory Visit | Attending: Family Medicine | Admitting: Family Medicine

## 2022-12-14 ENCOUNTER — Ambulatory Visit
Admission: RE | Admit: 2022-12-14 | Discharge: 2022-12-14 | Disposition: A | Discharge status: Home / Self Care | Payer: Medicare Other | Source: Ambulatory Visit | Attending: Family Medicine | Admitting: Family Medicine

## 2022-12-14 DIAGNOSIS — N644 Mastodynia: Secondary | ICD-10-CM

## 2022-12-14 DIAGNOSIS — R92322 Mammographic fibroglandular density, left breast: Secondary | ICD-10-CM | POA: Diagnosis not present

## 2022-12-14 DIAGNOSIS — N6311 Unspecified lump in the right breast, upper outer quadrant: Secondary | ICD-10-CM

## 2022-12-14 DIAGNOSIS — R92323 Mammographic fibroglandular density, bilateral breasts: Secondary | ICD-10-CM | POA: Diagnosis not present

## 2022-12-14 DIAGNOSIS — N63 Unspecified lump in unspecified breast: Secondary | ICD-10-CM | POA: Diagnosis not present

## 2022-12-14 DIAGNOSIS — R92321 Mammographic fibroglandular density, right breast: Secondary | ICD-10-CM | POA: Diagnosis not present

## 2023-05-10 DIAGNOSIS — M5431 Sciatica, right side: Secondary | ICD-10-CM | POA: Diagnosis not present

## 2023-06-26 DIAGNOSIS — M5416 Radiculopathy, lumbar region: Secondary | ICD-10-CM | POA: Diagnosis not present

## 2023-07-27 DIAGNOSIS — M25551 Pain in right hip: Secondary | ICD-10-CM | POA: Diagnosis not present

## 2023-07-27 DIAGNOSIS — M545 Low back pain, unspecified: Secondary | ICD-10-CM | POA: Diagnosis not present

## 2023-11-06 DIAGNOSIS — Z0000 Encounter for general adult medical examination without abnormal findings: Secondary | ICD-10-CM | POA: Diagnosis not present

## 2023-11-06 DIAGNOSIS — Z79899 Other long term (current) drug therapy: Secondary | ICD-10-CM | POA: Diagnosis not present

## 2023-11-06 DIAGNOSIS — F419 Anxiety disorder, unspecified: Secondary | ICD-10-CM | POA: Diagnosis not present

## 2023-11-06 DIAGNOSIS — Z1331 Encounter for screening for depression: Secondary | ICD-10-CM | POA: Diagnosis not present

## 2023-11-06 DIAGNOSIS — E782 Mixed hyperlipidemia: Secondary | ICD-10-CM | POA: Diagnosis not present

## 2023-11-06 DIAGNOSIS — E559 Vitamin D deficiency, unspecified: Secondary | ICD-10-CM | POA: Diagnosis not present

## 2023-11-13 ENCOUNTER — Other Ambulatory Visit: Payer: Self-pay | Admitting: Family Medicine

## 2023-11-13 DIAGNOSIS — Z0000 Encounter for general adult medical examination without abnormal findings: Secondary | ICD-10-CM

## 2023-11-13 DIAGNOSIS — E2839 Other primary ovarian failure: Secondary | ICD-10-CM

## 2023-11-28 ENCOUNTER — Telehealth: Payer: Self-pay

## 2023-11-28 NOTE — Telephone Encounter (Signed)
 Patient called to discuss the LCS program. Due to smoking Hx pt is not eligible.

## 2023-12-15 ENCOUNTER — Ambulatory Visit

## 2023-12-19 DIAGNOSIS — M25551 Pain in right hip: Secondary | ICD-10-CM | POA: Diagnosis not present

## 2023-12-21 ENCOUNTER — Ambulatory Visit
Admission: RE | Admit: 2023-12-21 | Discharge: 2023-12-21 | Discharge status: Home / Self Care | Source: Ambulatory Visit | Attending: Family Medicine | Admitting: Family Medicine

## 2023-12-21 DIAGNOSIS — Z0000 Encounter for general adult medical examination without abnormal findings: Secondary | ICD-10-CM

## 2023-12-21 DIAGNOSIS — Z1231 Encounter for screening mammogram for malignant neoplasm of breast: Secondary | ICD-10-CM | POA: Diagnosis not present

## 2023-12-27 ENCOUNTER — Encounter: Payer: Self-pay | Admitting: Family Medicine

## 2023-12-27 DIAGNOSIS — R21 Rash and other nonspecific skin eruption: Secondary | ICD-10-CM | POA: Diagnosis not present

## 2024-02-20 DIAGNOSIS — M25551 Pain in right hip: Secondary | ICD-10-CM | POA: Diagnosis not present

## 2024-02-27 DIAGNOSIS — E782 Mixed hyperlipidemia: Secondary | ICD-10-CM | POA: Diagnosis not present

## 2024-02-27 DIAGNOSIS — Z01818 Encounter for other preprocedural examination: Secondary | ICD-10-CM | POA: Diagnosis not present

## 2024-02-27 DIAGNOSIS — F17200 Nicotine dependence, unspecified, uncomplicated: Secondary | ICD-10-CM | POA: Diagnosis not present

## 2024-02-27 DIAGNOSIS — M25551 Pain in right hip: Secondary | ICD-10-CM | POA: Diagnosis not present

## 2024-03-05 ENCOUNTER — Telehealth (HOSPITAL_BASED_OUTPATIENT_CLINIC_OR_DEPARTMENT_OTHER): Payer: Self-pay

## 2024-03-05 ENCOUNTER — Telehealth: Payer: Self-pay

## 2024-03-05 NOTE — Telephone Encounter (Signed)
 Patient is calling to schedule a new patient for preop clearance. Patient has been may aware someone from our preop team will reach out to her. Please advise.

## 2024-03-05 NOTE — Telephone Encounter (Signed)
   Pre-operative Risk Assessment    Patient Name: Michelle Jensen  DOB: 04-18-1956 MRN: [id number]   Date of last office visit: No prior visit Date of next office visit: Not scheduled (new patient)   Request for Surgical Clearance    Procedure:  Right total hip arthroplasty  Date of Surgery:  Clearance TBD  (ASAP)              Surgeon:  Toribio Higashi, M.D. Surgeon's Group or Practice Name:  [facility] Phone number:  [phone] Fax number:  [phone]   Type of Clearance Requested:   - Medical    Type of Anesthesia:  Not Indicated   Additional requests/questions:    Bonney Ival LOISE Gerome   03/05/2024, 3:49 PM

## 2024-03-05 NOTE — Telephone Encounter (Signed)
   Name: Michelle Jensen  DOB: 08-16-1955  MRN: [id number]  Primary Cardiologist: None  Chart reviewed as part of pre-operative protocol coverage. Because of Adrean A Wilmot's past medical history and time since last visit, she will require a follow-up in-office visit in order to better assess preoperative cardiovascular risk.  Pre-op covering staff: - Please schedule appointment and call patient to inform them. If patient already had an upcoming appointment within acceptable timeframe, please add pre-op clearance to the appointment notes so provider is aware. - Please contact requesting surgeon's office via preferred method (i.e, phone, fax) to inform them of need for appointment prior to surgery.  Sumaya Riedesel D Crystina Borrayo, NP  03/05/2024, 4:17 PM

## 2024-03-05 NOTE — Telephone Encounter (Signed)
 Preop request received and placed in separate encounter, routed to preop

## 2024-03-06 NOTE — Telephone Encounter (Signed)
 Please schedule pt for [facility] as a preop NP appt.

## 2024-03-12 ENCOUNTER — Encounter: Payer: Self-pay | Admitting: Cardiology

## 2024-03-12 ENCOUNTER — Ambulatory Visit: Attending: Cardiology | Admitting: Cardiology

## 2024-03-12 ENCOUNTER — Ambulatory Visit: Admitting: Cardiology

## 2024-03-12 ENCOUNTER — Ambulatory Visit (HOSPITAL_COMMUNITY)
Admission: RE | Admit: 2024-03-12 | Discharge: 2024-03-12 | Disposition: A | Discharge status: Home / Self Care | Payer: Self-pay | Source: Ambulatory Visit | Attending: Cardiology | Admitting: Cardiology

## 2024-03-12 VITALS — BP 126/74 | HR 73 | Ht 65.0 in | Wt 170.0 lb

## 2024-03-12 DIAGNOSIS — Z01818 Encounter for other preprocedural examination: Secondary | ICD-10-CM

## 2024-03-12 DIAGNOSIS — Z136 Encounter for screening for cardiovascular disorders: Secondary | ICD-10-CM | POA: Insufficient documentation

## 2024-03-12 DIAGNOSIS — E782 Mixed hyperlipidemia: Secondary | ICD-10-CM | POA: Diagnosis not present

## 2024-03-12 DIAGNOSIS — F17210 Nicotine dependence, cigarettes, uncomplicated: Secondary | ICD-10-CM

## 2024-03-12 DIAGNOSIS — I3481 Nonrheumatic mitral (valve) annulus calcification: Secondary | ICD-10-CM | POA: Insufficient documentation

## 2024-03-12 NOTE — Patient Instructions (Signed)
 Testing/Procedures: Echo   Your physician has requested that you have an echocardiogram. Echocardiography is a painless test that uses sound waves to create images of your heart. It provides your doctor with information about the size and shape of your heart and how well your heart's chambers and valves are working. This procedure takes approximately one hour. There are no restrictions for this procedure. Please do NOT wear cologne, perfume, aftershave, or lotions (deodorant is allowed). Please arrive 15 minutes prior to your appointment time.  Please note: We ask at that you not bring children with you during ultrasound (echo/ vascular) testing. Due to room size and safety concerns, children are not allowed in the ultrasound rooms during exams. Our front office staff cannot provide observation of children in our lobby area while testing is being conducted. An adult accompanying a patient to their appointment will only be allowed in the ultrasound room at the discretion of the ultrasound technician under special circumstances. We apologize for any inconvenience.   Calcium Score   CT scanning for a cardiac calcium score (CAT scanning), is a noninvasive, special x-ray that produces cross-sectional images of the body using x-rays and a computer. CT scans help physicians diagnose and treat medical conditions. For some CT exams, a contrast material is used to enhance visibility in the area of the body being studied. CT scans provide greater clarity and reveal more details than regular x-ray exams. This is not covered by insurance and will be an out-of-pocket cost of approximately $99.   Follow-Up: At [facility], you and your health needs are our priority.  As part of our continuing mission to provide you with exceptional heart care, our providers are all part of one team.  This team includes your primary Cardiologist (physician) and Advanced Practice Providers or APPs (Physician Assistants and  Nurse Practitioners) who all work together to provide you with the care you need, when you need it.  Your next appointment:   As needed   Provider:   Newman JINNY Lawrence, MD

## 2024-03-12 NOTE — Progress Notes (Addendum)
 Cardiology Office Note:  .   Date:  03/12/2024  ID:  Michelle Jensen, DOB Apr 08, 1956, MRN [id number] PCP: Job Lukes, PA  [facility] Providers Cardiologist:  Newman Lawrence, MD PCP: Job Lukes, PA  Chief Complaint  Patient presents with   Pre-op Exam     Michelle Jensen is a 68 y.o. female with mixed hyperlipidemia, nicotine dependence, preop evaluation  Discussed the use of AI scribe software for clinical note transcription with the patient, who gave verbal consent to proceed.  History of Present Illness Michelle Jensen is a 68 year old female who presents for pre-operative cardiac evaluation prior to hip surgery.  She experiences significant right hip pain for the past four to five months, severely limiting her mobility. She was previously more active during her employment at [employer].Since her retirement in 2015, she has been physically active with good functional capacity up until last 4-5 months, activity limited due to hip pain.    Her family history includes calcification of the aortic valve in her father, sister, and brother. She has not been evaluated for this condition. She smokes approximately one pack of cigarettes every three days. She denies chest pain or shortness of breath, even with physical activity.      Vitals:   03/12/24 1356  BP: 126/74  Pulse: 73  SpO2: 97%      Review of Systems  Cardiovascular:  Negative for chest pain, dyspnea on exertion, leg swelling, palpitations and syncope.  Musculoskeletal:  Positive for joint pain.        Studies Reviewed: SABRA        EKG 03/12/2024: Normal sinus rhythm Nonspecific ST abnormality No previous ECGs available    Labs 02/2024: Chol 224, TG 151, HDL 71, LDL 126 Hb 13.6 Cr 0.62   Physical Exam Vitals and nursing note reviewed.  Constitutional:      General: She is not in acute distress. Neck:     Vascular: No JVD.  Cardiovascular:     Rate and Rhythm: Normal  rate and regular rhythm.     Heart sounds: Normal heart sounds. No murmur heard. Pulmonary:     Effort: Pulmonary effort is normal.     Breath sounds: Normal breath sounds. No wheezing or rales.  Musculoskeletal:     Right lower leg: No edema.     Left lower leg: No edema.      VISIT DIAGNOSES:   ICD-10-CM   1. Preoperative clearance  Z01.818 EKG 12-Lead    ECHOCARDIOGRAM COMPLETE    2. Mixed hyperlipidemia  E78.2 CT CARDIAC SCORING (SELF PAY ONLY)    3. Nicotine dependence, cigarettes, uncomplicated  F17.210        Michelle Jensen is a 67 y.o. female with mixed hyperlipidemia, nicotine dependence, preop evaluation Assessment & Plan Preop evaluation: Good baseline functional capacity up until few months ago without any complaints of chest pain or shortness of breath. Recommend echocardiogram for baseline assessment.  Unlikely that she will require stress testing, unless screening CT cardiac scoring scan shows very high levels of coronary artery calcium.  Mixed hyperlipidemia: Cholesterol levels slightly elevated. Discussed coronary artery disease risk stratification. Recommended CT scan for calcium score assessment. Explained potential statin therapy or stress test based on findings. - Order CT cardiac score scan. - Consider statin therapy if minimal calcium is found. - Consider stress test if significant calcium is found (>100).  Tobacco use disorder: Tobacco cessation counseling: - Currently smoking 1/3 packs/day   -  Patient was informed of the dangers of tobacco abuse including stroke, cancer, and MI, as well as benefits of tobacco cessation. - Patient is willing to quit at this time. - Approximately 5 mins were spent counseling patient cessation techniques. We discussed various methods to help quit smoking, including deciding on a date to quit, joining a support group, pharmacological agents. Patient would like to consider nicotine patch, if needed. - I will reassess her  progress at the next follow-up visit      F/u as needed, unless significant abnormalities found on above testing.  Signed, Newman JINNY Lawrence, MD

## 2024-03-13 ENCOUNTER — Encounter: Payer: Self-pay | Admitting: Cardiology

## 2024-03-13 DIAGNOSIS — Z01818 Encounter for other preprocedural examination: Secondary | ICD-10-CM | POA: Insufficient documentation

## 2024-03-13 DIAGNOSIS — F17210 Nicotine dependence, cigarettes, uncomplicated: Secondary | ICD-10-CM | POA: Insufficient documentation

## 2024-03-13 DIAGNOSIS — E782 Mixed hyperlipidemia: Secondary | ICD-10-CM | POA: Insufficient documentation

## 2024-03-15 ENCOUNTER — Ambulatory Visit: Payer: Self-pay | Admitting: Cardiology

## 2024-03-15 DIAGNOSIS — E782 Mixed hyperlipidemia: Secondary | ICD-10-CM

## 2024-03-15 NOTE — Progress Notes (Signed)
 Mild coronary calcification noted.  No recent lipid panel.  In the past, cholesterol was quite high.  Recommend Crestor 20 mg daily, check lipid panel in 04/2024.  Thanks MJP

## 2024-03-20 ENCOUNTER — Ambulatory Visit (HOSPITAL_COMMUNITY)
Admission: RE | Admit: 2024-03-20 | Discharge: 2024-03-20 | Disposition: A | Discharge status: Home / Self Care | Source: Ambulatory Visit | Attending: Cardiology | Admitting: Cardiology

## 2024-03-20 DIAGNOSIS — Z01810 Encounter for preprocedural cardiovascular examination: Secondary | ICD-10-CM | POA: Insufficient documentation

## 2024-03-20 DIAGNOSIS — Z01818 Encounter for other preprocedural examination: Secondary | ICD-10-CM | POA: Insufficient documentation

## 2024-03-20 DIAGNOSIS — I350 Nonrheumatic aortic (valve) stenosis: Secondary | ICD-10-CM | POA: Diagnosis not present

## 2024-03-20 LAB — ECHOCARDIOGRAM COMPLETE
Area-P 1/2: 3.95 cm2
P 1/2 time: 544 ms
S' Lateral: 2.7 cm

## 2024-03-20 NOTE — Progress Notes (Signed)
 Yes, okay to check lipid panel, Diagnosis: Mixed hyperlipidemia  Thanks MJP

## 2024-03-21 ENCOUNTER — Telehealth: Payer: Self-pay | Admitting: Cardiology

## 2024-03-21 NOTE — Telephone Encounter (Signed)
 Pt returning Michelle Jensen's call. Pt notified that a lipid panel has been ordered. Pt aware that she should be fasting for 8 hrs prior. Pt plans to go to [facility] in the next few days. Pt verbalized understanding. All questions if any were answered.

## 2024-03-21 NOTE — Telephone Encounter (Signed)
 Patient stated she is returning phone call to [employer]. Please advise.

## 2024-04-09 DIAGNOSIS — E782 Mixed hyperlipidemia: Secondary | ICD-10-CM | POA: Diagnosis not present

## 2024-04-10 LAB — LIPID PANEL
Chol/HDL Ratio: 3.1 ratio (ref 0.0–4.4)
Cholesterol, Total: 224 mg/dL — ABNORMAL HIGH (ref 100–199)
HDL: 72 mg/dL (ref >39)
LDL Chol Calc (NIH): 129 mg/dL — ABNORMAL HIGH (ref 0–99)
Triglycerides: 131 mg/dL (ref 0–149)
VLDL Cholesterol Cal: 23 mg/dL (ref 5–40)

## 2024-04-11 ENCOUNTER — Telehealth: Payer: Self-pay | Admitting: Cardiology

## 2024-04-11 NOTE — Telephone Encounter (Signed)
 Just sent lab results separately.  Please check.  Thanks MJP

## 2024-04-11 NOTE — Telephone Encounter (Signed)
 Patient would like to discuss her lab results.

## 2024-04-11 NOTE — Telephone Encounter (Signed)
 Spoke with the patient and advised that Dr. Elmira has not reviewed his lab results yet. She is aware that her LDL is elevated and he will likely recommend starting on Crestor  as previously recommended. Patient would also like to know about clearance for hip surgery that she is trying to get scheduled. She has completed all testing that was recommended. Advised patient that message would be sent to Dr. Elmira.

## 2024-04-12 MED ORDER — ROSUVASTATIN CALCIUM 20 MG PO TABS: 20.0000 mg | ORAL_TABLET | Freq: Every day | ORAL | 3 refills | Status: AC

## 2024-04-12 NOTE — Telephone Encounter (Signed)
 Low perioperative cardiac risk, okay to proceed with hip surgery.  Thanks MJP

## 2024-04-16 ENCOUNTER — Other Ambulatory Visit (HOSPITAL_COMMUNITY)

## 2024-04-16 DIAGNOSIS — E782 Mixed hyperlipidemia: Secondary | ICD-10-CM | POA: Diagnosis not present

## 2024-04-16 DIAGNOSIS — M25551 Pain in right hip: Secondary | ICD-10-CM | POA: Diagnosis not present

## 2024-04-16 DIAGNOSIS — R931 Abnormal findings on diagnostic imaging of heart and coronary circulation: Secondary | ICD-10-CM | POA: Diagnosis not present

## 2024-04-16 DIAGNOSIS — F17200 Nicotine dependence, unspecified, uncomplicated: Secondary | ICD-10-CM | POA: Diagnosis not present

## 2024-04-19 NOTE — Telephone Encounter (Signed)
 Pt was seen by Dr. Elmira as new pt on 03/12/24. Will send to preop APP to review if the pt has been cleared. Per MD notes looks like the pt need cardiac testing as well.

## 2024-04-19 NOTE — Telephone Encounter (Signed)
   Patient Name: Michelle Jensen  DOB: 1955/10/09 MRN: [id number]  Primary Cardiologist: Newman JINNY Trevione Wert, MD  Chart reviewed as part of pre-operative protocol coverage. Given past medical history and time since last visit, based on ACC/AHA guidelines, [name] is at acceptable risk for the planned procedure without further cardiovascular testing.    Per-Dr.Patwardhan 03/12/2024. Low perioperative cardiac risk, okay to proceed with hip surgery.   Thanks MJP  The patient was advised that if she develops new symptoms prior to surgery to contact our office to arrange for a follow-up visit, and she verbalized understanding.  I will route this recommendation to the requesting party via Epic fax function and remove from pre-op pool.  Please call with questions.  Lamarr Satterfield, NP 04/19/2024, 12:19 PM

## 2024-04-19 NOTE — Telephone Encounter (Signed)
 Office on behalf of the patient to see the status of the clearance. Please advise

## 2024-04-26 DIAGNOSIS — M25551 Pain in right hip: Secondary | ICD-10-CM | POA: Diagnosis not present

## 2024-05-24 DIAGNOSIS — M1611 Unilateral primary osteoarthritis, right hip: Secondary | ICD-10-CM | POA: Diagnosis not present

## 2024-05-30 DIAGNOSIS — R262 Difficulty in walking, not elsewhere classified: Secondary | ICD-10-CM | POA: Diagnosis not present

## 2024-05-30 DIAGNOSIS — M1611 Unilateral primary osteoarthritis, right hip: Secondary | ICD-10-CM | POA: Diagnosis not present

## 2024-05-30 DIAGNOSIS — M6281 Muscle weakness (generalized): Secondary | ICD-10-CM | POA: Diagnosis not present

## 2024-06-03 DIAGNOSIS — M6281 Muscle weakness (generalized): Secondary | ICD-10-CM | POA: Diagnosis not present

## 2024-06-03 DIAGNOSIS — R262 Difficulty in walking, not elsewhere classified: Secondary | ICD-10-CM | POA: Diagnosis not present

## 2024-06-03 DIAGNOSIS — M1611 Unilateral primary osteoarthritis, right hip: Secondary | ICD-10-CM | POA: Diagnosis not present

## 2024-06-06 DIAGNOSIS — Z4789 Encounter for other orthopedic aftercare: Secondary | ICD-10-CM | POA: Diagnosis not present

## 2024-06-06 DIAGNOSIS — Z96641 Presence of right artificial hip joint: Secondary | ICD-10-CM | POA: Diagnosis not present

## 2024-07-04 DIAGNOSIS — Z4789 Encounter for other orthopedic aftercare: Secondary | ICD-10-CM | POA: Diagnosis not present

## 2024-07-04 DIAGNOSIS — Z96641 Presence of right artificial hip joint: Secondary | ICD-10-CM | POA: Diagnosis not present

## 2024-07-08 ENCOUNTER — Other Ambulatory Visit

## 2024-07-24 ENCOUNTER — Other Ambulatory Visit (HOSPITAL_BASED_OUTPATIENT_CLINIC_OR_DEPARTMENT_OTHER)

## 2024-07-26 ENCOUNTER — Other Ambulatory Visit
